# Patient Record
Sex: Female | Born: 1970 | Race: White | Hispanic: No | Marital: Married | Smoking: Never smoker
Health system: Southern US, Community
[De-identification: ages and names within clinical notes are randomized; demographics above are authoritative.]

## PROBLEM LIST (undated history)

## (undated) ENCOUNTER — Inpatient Hospital Stay (HOSPITAL_COMMUNITY): Payer: Self-pay

## (undated) DIAGNOSIS — I7300 Raynaud's syndrome without gangrene: Secondary | ICD-10-CM

## (undated) DIAGNOSIS — I499 Cardiac arrhythmia, unspecified: Secondary | ICD-10-CM

## (undated) HISTORY — PX: DILATION AND CURETTAGE OF UTERUS: SHX78

## (undated) HISTORY — DX: Raynaud's syndrome without gangrene: I73.00

## (undated) HISTORY — DX: Cardiac arrhythmia, unspecified: I49.9

---

## 1998-08-22 ENCOUNTER — Emergency Department (HOSPITAL_COMMUNITY): Admission: EM | Admit: 1998-08-22 | Discharge: 1998-08-22 | Payer: Self-pay | Admitting: Emergency Medicine

## 1998-08-22 ENCOUNTER — Encounter: Payer: Self-pay | Admitting: Emergency Medicine

## 1998-10-23 ENCOUNTER — Other Ambulatory Visit: Admission: RE | Admit: 1998-10-23 | Discharge: 1998-10-23 | Payer: Self-pay | Admitting: Obstetrics and Gynecology

## 2000-06-23 ENCOUNTER — Other Ambulatory Visit: Admission: RE | Admit: 2000-06-23 | Discharge: 2000-06-23 | Payer: Self-pay | Admitting: Obstetrics and Gynecology

## 2000-10-14 ENCOUNTER — Encounter: Payer: Self-pay | Admitting: Internal Medicine

## 2000-10-14 ENCOUNTER — Encounter: Admission: RE | Admit: 2000-10-14 | Discharge: 2000-10-14 | Payer: Self-pay | Admitting: Internal Medicine

## 2000-10-16 ENCOUNTER — Ambulatory Visit (HOSPITAL_COMMUNITY): Admission: RE | Admit: 2000-10-16 | Discharge: 2000-10-16 | Payer: Self-pay | Admitting: Internal Medicine

## 2000-12-11 ENCOUNTER — Other Ambulatory Visit: Admission: RE | Admit: 2000-12-11 | Discharge: 2000-12-11 | Payer: Self-pay | Admitting: Obstetrics and Gynecology

## 2001-09-22 ENCOUNTER — Other Ambulatory Visit: Admission: RE | Admit: 2001-09-22 | Discharge: 2001-09-22 | Payer: Self-pay | Admitting: Obstetrics and Gynecology

## 2002-02-21 ENCOUNTER — Emergency Department (HOSPITAL_COMMUNITY): Admission: EM | Admit: 2002-02-21 | Discharge: 2002-02-22 | Payer: Self-pay | Admitting: Emergency Medicine

## 2002-02-22 ENCOUNTER — Encounter: Payer: Self-pay | Admitting: Emergency Medicine

## 2002-10-26 ENCOUNTER — Other Ambulatory Visit: Admission: RE | Admit: 2002-10-26 | Discharge: 2002-10-26 | Payer: Self-pay | Admitting: Obstetrics and Gynecology

## 2003-10-28 ENCOUNTER — Other Ambulatory Visit: Admission: RE | Admit: 2003-10-28 | Discharge: 2003-10-28 | Payer: Self-pay | Admitting: Obstetrics and Gynecology

## 2005-01-30 ENCOUNTER — Other Ambulatory Visit: Admission: RE | Admit: 2005-01-30 | Discharge: 2005-01-30 | Payer: Self-pay | Admitting: Obstetrics and Gynecology

## 2005-08-21 ENCOUNTER — Inpatient Hospital Stay (HOSPITAL_COMMUNITY): Admission: AD | Admit: 2005-08-21 | Discharge: 2005-08-23 | Payer: Self-pay | Admitting: Obstetrics and Gynecology

## 2006-09-29 ENCOUNTER — Inpatient Hospital Stay (HOSPITAL_COMMUNITY): Admission: RE | Admit: 2006-09-29 | Discharge: 2006-09-30 | Payer: Self-pay | Admitting: Obstetrics and Gynecology

## 2008-03-29 ENCOUNTER — Inpatient Hospital Stay (HOSPITAL_COMMUNITY): Admission: RE | Admit: 2008-03-29 | Discharge: 2008-03-30 | Payer: Self-pay | Admitting: Obstetrics and Gynecology

## 2008-03-31 ENCOUNTER — Encounter: Admission: RE | Admit: 2008-03-31 | Discharge: 2008-04-30 | Payer: Self-pay | Admitting: Obstetrics and Gynecology

## 2008-05-01 ENCOUNTER — Encounter: Admission: RE | Admit: 2008-05-01 | Discharge: 2008-05-30 | Payer: Self-pay | Admitting: Obstetrics and Gynecology

## 2010-08-21 NOTE — Discharge Summary (Signed)
[name], [name]              ACCOUNT NO.:  [id number]   MEDICAL RECORD NO.:  [id number]          PATIENT TYPE:  INP   LOCATION:  9106                          FACILITY:  [facility]   PHYSICIAN:  Huel Cote, M.D. DATE OF BIRTH:  September 24, 1970   DATE OF ADMISSION:  03/29/2008  DATE OF DISCHARGE:  03/30/2008                               DISCHARGE SUMMARY   DISCHARGE DIAGNOSES:  1. Term pregnancy at 37 and 3/7th weeks delivered.  2. Status post normal spontaneous vaginal delivery.   DISCHARGE MEDICATION:  Motrin 600 mg p.o. every 6 hours.   DISCHARGE FOLLOWUP:  The patient is to follow up in my office in 6 weeks  for her routine postpartum exam.   HOSPITAL COURSE:  The patient is a 40 year old G4, P2-0-1-2 who is  admitted at 70 and 3/7th weeks' gestation for induction of labor at her  due date and a favorable cervix.  Prenatal care was uneventful.   PRENATAL LABS:  An O positive, antibody negative, RPR nonreactive,  rubella immune, hepatitis B surface antigen negative, HIV negative, GC  negative, Chlamydia negative, group B strep negative, 1-hour Glucola  100.  First trimester screen normal.   PAST OBSTETRICAL HISTORY:  In 2006, she had a spontaneous miscarriage.  In 2007, she had a vaginal delivery of a 7-pound 13-ounce infant.  In  2008, she had a vaginal delivery of a 7-pound 4-ounce infant.   PAST GYN HISTORY:  None.   PAST MEDICAL HISTORY:  None.   PAST SURGICAL HISTORY:  None.   ALLERGIES:  None.   MEDICATIONS:  Prenatal vitamins.   PHYSICAL EXAMINATION:  VITAL SIGNS:  On admission, she was afebrile with  stable vital signs.  PELVIC:  Fetal heart rate was reactive.  Cervix was 52+ dilated and a -2  station.   She had rupture of membranes performed with clear fluid and was placed  on Pitocin.  She progressed quickly throughout the day.  Had a rapid  descent and completion of dilation and pushed well with a normal  spontaneous vaginal delivery of a vigorous female  infant over a small  first-degree laceration.  Apgars were 9 and 9, weight was 7  pounds 15 ounces.  There was a nuchal cord x1, which was reduced over  the head.  Placenta was delivered spontaneously.  A first-degree  laceration was repaired with 2-0 Vicryl.  Cervix and rectum were intact.  Estimated blood loss 350 mL.      Huel Cote, M.D.  Electronically Signed     KR/MEDQ  D:  05/07/2008  T:  05/07/2008  Job:  [id number]

## 2011-01-11 LAB — CBC
HCT: 32.3 % — ABNORMAL LOW (ref 36.0–46.0)
HCT: 36.3 % (ref 36.0–46.0)
Hemoglobin: 11.5 g/dL — ABNORMAL LOW (ref 12.0–15.0)
Hemoglobin: 12.8 g/dL (ref 12.0–15.0)
MCHC: 35.4 g/dL (ref 30.0–36.0)
MCHC: 35.6 g/dL (ref 30.0–36.0)
MCV: 100.8 fL — ABNORMAL HIGH (ref 78.0–100.0)
MCV: 99.3 fL (ref 78.0–100.0)
Platelets: 128 10*3/uL — ABNORMAL LOW (ref 150–400)
Platelets: 150 10*3/uL (ref 150–400)
RBC: 3.21 MIL/uL — ABNORMAL LOW (ref 3.87–5.11)
RBC: 3.66 MIL/uL — ABNORMAL LOW (ref 3.87–5.11)
RDW: 12.6 % (ref 11.5–15.5)
RDW: 12.7 % (ref 11.5–15.5)
WBC: 10.5 10*3/uL (ref 4.0–10.5)
WBC: 9.9 10*3/uL (ref 4.0–10.5)

## 2011-01-11 LAB — RPR: RPR Ser Ql: NONREACTIVE

## 2011-01-23 LAB — CBC
HCT: 40.3
Platelets: 146 — ABNORMAL LOW
RBC: 3.38 — ABNORMAL LOW
WBC: 11.3 — ABNORMAL HIGH
WBC: 7.8

## 2011-01-23 LAB — RPR: RPR Ser Ql: NONREACTIVE

## 2011-08-09 ENCOUNTER — Encounter: Payer: Self-pay | Admitting: Family Medicine

## 2012-01-22 ENCOUNTER — Ambulatory Visit (INDEPENDENT_AMBULATORY_CARE_PROVIDER_SITE_OTHER): Payer: 59 | Admitting: Physician Assistant

## 2012-01-22 VITALS — BP 102/63 | HR 75 | Temp 98.2°F | Resp 20 | Ht 68.0 in | Wt 136.0 lb

## 2012-01-22 DIAGNOSIS — R05 Cough: Secondary | ICD-10-CM

## 2012-01-22 DIAGNOSIS — R059 Cough, unspecified: Secondary | ICD-10-CM

## 2012-01-22 DIAGNOSIS — J019 Acute sinusitis, unspecified: Secondary | ICD-10-CM

## 2012-01-22 MED ORDER — BENZONATATE 200 MG PO CAPS: 200.0000 mg | ORAL_CAPSULE | Freq: Two times a day (BID) | ORAL | Status: DC | PRN

## 2012-01-22 MED ORDER — GUAIFENESIN-CODEINE 100-10 MG/5ML PO SOLN: ORAL | Status: DC

## 2012-01-22 MED ORDER — AMOXICILLIN-POT CLAVULANATE 875-125 MG PO TABS: 1.0000 | ORAL_TABLET | Freq: Two times a day (BID) | ORAL | Status: DC

## 2012-01-22 MED ORDER — IPRATROPIUM BROMIDE 0.06 % NA SOLN: 2.0000 | Freq: Three times a day (TID) | NASAL | Status: DC

## 2012-01-22 NOTE — Progress Notes (Signed)
Patient ID: MEGGIN OLA MRN: [id number], DOB: 26-Jul-1970, 41 y.o. Date of Encounter: 01/22/2012, 8:54 PM  Primary Physician: No primary provider on file.  Chief Complaint:  Chief Complaint  Patient presents with  . Nasal Congestion    x 10 days  . Sinusitis  . Cough  . Otalgia    Bilateral  . Headache  . Facial Pain    Over sinuses    HPI: 41 y.o. year old female presents with a ten day history of worsening nasal congestion, post nasal drip, sore throat, sinus pressure, and cough. Afebrile, but some chills. Nasal congestion thick and green/yellow. Sinus pressure is the worst symptom along the right maxillary sinus. Cough is mildly productive. Patient states her pain in the right maxillary sinus worsens when she leans forward. Ears feel full, leading to sensation of muffled hearing. Has tried OTC cold preps without much real success. 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   No recent antibiotics, or recent travels  No leg trauma, sedentary periods, h/o cancer, or tobacco use.  Past Medical History  Diagnosis Date  . Raynaud's syndrome      Home Meds: Prior to Admission medications   Medication Sig Start Date End Date Taking? Authorizing Provider  fish oil-omega-3 fatty acids 1000 MG capsule Take 2 g by mouth daily.   Yes Historical Provider, MD  levonorgestrel (MIRENA) 20 MCG/24HR IUD 1 each by Intrauterine route once.   Yes Historical Provider, MD  NIFEdipine (PROCARDIA) 20 MG capsule Take 20 mg by mouth as needed.   Yes Historical Provider, MD                                Allergies: No Known Allergies  History   Social History  . Marital Status: Married    Spouse Name: N/A    Number of Children: N/A  . Years of Education: N/A   Occupational History  . Not on file.   Social History Main Topics  . Smoking status: Never Smoker   . Smokeless tobacco: Not on file  . Alcohol Use: Not on file  .  Drug Use: Not on file  . Sexually Active: Not on file   Other Topics Concern  . Not on file   Social History Narrative  . No narrative on file     Review of Systems: Constitutional: negative for fever, night sweats or weight changes Cardiovascular: negative for chest pain or palpitations Respiratory: negative for hemoptysis, wheezing, or shortness of breath Neurologic: positive for sinus headache   Physical Exam: Blood pressure 102/63, pulse 75, temperature 98.2 F (36.8 C), temperature source Oral, resp. rate 20, height 5\' 8"  (1.727 m), weight 136 lb (61.689 kg), last menstrual period 12/23/2011, SpO2 100.00%., Body mass index is 20.68 kg/(m^2). General: Well developed, well nourished, in no acute distress. Head: Normocephalic, atraumatic, eyes without discharge, sclera non-icteric. Bilateral auditory canals clear, TM's are without perforation, pearly grey with reflective cone of light bilaterally. Serous effusion bilaterally behind TM's. Right maxillary sinus TTP. Oral cavity moist, dentition normal. Posterior pharynx with post nasal drip and mild erythema. No peritonsillar abscess or tonsillar exudate. Neck: Supple. No thyromegaly. Full ROM. No lymphadenopathy. Lungs: Clear bilaterally to auscultation without wheezes, rales, or rhonchi. Breathing is unlabored.  Heart: RRR with S1 S2. No murmurs, rubs, or gallops appreciated. Msk:  Strength and tone normal for  age. Extremities: No clubbing or cyanosis. No edema. Neuro: Alert and oriented X 3. Moves all extremities spontaneously. CNII-XII grossly in tact. Psych:  Responds to questions appropriately with a normal affect.     ASSESSMENT AND PLAN:  41 y.o. year old female with sinusitis -Augmentin 875/125 mg 1 po bid #20 no RF -Atrovent NS 0.06% 2 sprays each nare bid prn #1 no RF -Robitussin AC 1 tsp po q 4-6 hours prn cough #120 mL no RF -Tessalon Perles 100 mg 1 po tid prn cough #30 no RF  -Mucinex -Can call if sinus pressure  persists along right maxillary sinus and will send in prednisone 20 mg 3x2, 2x2, 1x2 -Tylenol/Motrin prn -Rest/fluids -RTC precautions -RTC 3-5 days if no improvement  Elinor Dodge, PA-C 01/22/2012 8:54 PM

## 2012-01-22 NOTE — Patient Instructions (Addendum)
Sinusitis Take your Augmentin twice daily You can use the Atrovent nasal spray 1-3 times daily, if it is drying out your nose you can add some saline nasal spray If you continue to have sinus pressure over the weekend please give me a call and we can discuss other treatment options.   Sinusitis is redness, soreness, and swelling (inflammation) of the paranasal sinuses. Paranasal sinuses are air pockets within the bones of your face (beneath the eyes, the middle of the forehead, or above the eyes). In healthy paranasal sinuses, mucus is able to drain out, and air is able to circulate through them by way of your nose. However, when your paranasal sinuses are inflamed, mucus and air can become trapped. This can allow bacteria and other germs to grow and cause infection. Sinusitis can develop quickly and last only a short time (acute) or continue over a long period (chronic). Sinusitis that lasts for more than 12 weeks is considered chronic.  CAUSES  Causes of sinusitis include:  Allergies.  Structural abnormalities, such as displacement of the cartilage that separates your nostrils (deviated septum), which can decrease the air flow through your nose and sinuses and affect sinus drainage.  Functional abnormalities, such as when the small hairs (cilia) that line your sinuses and help remove mucus do not work properly or are not present. SYMPTOMS  Symptoms of acute and chronic sinusitis are the same. The primary symptoms are pain and pressure around the affected sinuses. Other symptoms include:  Upper toothache.  Earache.  Headache.  Bad breath.  Decreased sense of smell and taste.  A cough, which worsens when you are lying flat.  Fatigue.  Fever.  Thick drainage from your nose, which often is green and may contain pus (purulent).  Swelling and warmth over the affected sinuses. DIAGNOSIS  Your caregiver will perform a physical exam. During the exam, your caregiver may:  Look in  your nose for signs of abnormal growths in your nostrils (nasal polyps).  Tap over the affected sinus to check for signs of infection.  View the inside of your sinuses (endoscopy) with a special imaging device with a light attached (endoscope), which is inserted into your sinuses. If your caregiver suspects that you have chronic sinusitis, one or more of the following tests may be recommended:  Allergy tests.  Nasal culture A sample of mucus is taken from your nose and sent to a lab and screened for bacteria.  Nasal cytology A sample of mucus is taken from your nose and examined by your caregiver to determine if your sinusitis is related to an allergy. TREATMENT  Most cases of acute sinusitis are related to a viral infection and will resolve on their own within 10 days. Sometimes medicines are prescribed to help relieve symptoms (pain medicine, decongestants, nasal steroid sprays, or saline sprays).  However, for sinusitis related to a bacterial infection, your caregiver will prescribe antibiotic medicines. These are medicines that will help kill the bacteria causing the infection.  Rarely, sinusitis is caused by a fungal infection. In theses cases, your caregiver will prescribe antifungal medicine. For some cases of chronic sinusitis, surgery is needed. Generally, these are cases in which sinusitis recurs more than 3 times per year, despite other treatments. HOME CARE INSTRUCTIONS   Drink plenty of water. Water helps thin the mucus so your sinuses can drain more easily.  Use a humidifier.  Inhale steam 3 to 4 times a day (for example, sit in the bathroom with the shower running).  Apply a warm, moist washcloth to your face 3 to 4 times a day, or as directed by your caregiver.  Use saline nasal sprays to help moisten and clean your sinuses.  Take over-the-counter or prescription medicines for pain, discomfort, or fever only as directed by your caregiver. SEEK IMMEDIATE MEDICAL CARE  IF:  You have increasing pain or severe headaches.  You have nausea, vomiting, or drowsiness.  You have swelling around your face.  You have vision problems.  You have a stiff neck.  You have difficulty breathing. MAKE SURE YOU:   Understand these instructions.  Will watch your condition.  Will get help right away if you are not doing well or get worse. Document Released: 03/25/2005 Document Revised: 06/17/2011 Document Reviewed: 04/09/2011 [facility] Patient Information 2013 [place], [place].

## 2012-09-20 NOTE — H&P (Signed)
[name] is an 42 y.o. female. [id number] with an unfortunate finding of a missed abortion on [place] measuring 7+weeks.  Pt was having routine f/u US when fetal demise discovered.  She has had no VB or cramping.  Her blood type is Rh positive.  Pertinent Gynecological History: OB History: NSVD x 3                         SAB x 1 Menstrual History:  No LMP recorded.    Past Medical History  Diagnosis Date  . Raynaud's syndrome     No past surgical history on file.  No family history on file.  Social History:  reports that she has never smoked. She does not have any smokeless tobacco history on file. Her alcohol and drug histories are not on file.  Allergies: No Known Allergies  No prescriptions prior to admission    ROS  There were no vitals taken for this visit. Physical Exam  Constitutional: She is oriented to person, place, and time. She appears well-developed and well-nourished.  Cardiovascular: Normal rate and regular rhythm.   Respiratory: Effort normal and breath sounds normal.  GI: Soft.  Genitourinary: Vagina normal.  Uterus 8 weeks size  Neurological: She is alert and oriented to person, place, and time.  Psychiatric: She has a normal mood and affect. Her behavior is normal.    No results found for this or any previous visit (from the past 24 hour(s)).  No results found.  Assessment/Plan: The findings of missed abortion were discussed with the patient in detail. We discussed her options going forward with expectant management, cytotec induced bleeding, and D&C. The risks and benefits of each approach were reviewed and the patient elects to schedule a D&C. The risks of the procedure were reviewed with her in detail including bleeding, infection, and possible uterine perforation. She will be prescribed of cytotec to moisten and insert vaginally 3 hours prior to the procedure to aid with cervical dilation. Her blood type will be confirmed and the procedure  scheduled. She will also take doxycycline prior to and 12 hours followiing procedure.   Oliver Pila 09/20/2012, 9:47 PM

## 2012-09-21 ENCOUNTER — Encounter (HOSPITAL_COMMUNITY): Payer: Self-pay | Admitting: Pharmacist

## 2012-09-22 ENCOUNTER — Encounter (HOSPITAL_COMMUNITY): Payer: Self-pay | Admitting: *Deleted

## 2012-09-22 ENCOUNTER — Encounter (HOSPITAL_COMMUNITY): Payer: Self-pay | Admitting: Anesthesiology

## 2012-09-22 ENCOUNTER — Ambulatory Visit (HOSPITAL_COMMUNITY): Payer: 59 | Admitting: Anesthesiology

## 2012-09-22 ENCOUNTER — Ambulatory Visit (HOSPITAL_COMMUNITY)
Admission: RE | Admit: 2012-09-22 | Discharge: 2012-09-22 | Disposition: A | Discharge status: Home / Self Care | Payer: 59 | Source: Ambulatory Visit | Attending: Obstetrics and Gynecology | Admitting: Obstetrics and Gynecology

## 2012-09-22 ENCOUNTER — Encounter (HOSPITAL_COMMUNITY)
Admission: RE | Disposition: A | Discharge status: Home / Self Care | Payer: Self-pay | Source: Ambulatory Visit | Attending: Obstetrics and Gynecology

## 2012-09-22 DIAGNOSIS — O021 Missed abortion: Secondary | ICD-10-CM | POA: Insufficient documentation

## 2012-09-22 DIAGNOSIS — I7300 Raynaud's syndrome without gangrene: Secondary | ICD-10-CM | POA: Insufficient documentation

## 2012-09-22 HISTORY — PX: DILATION AND EVACUATION: SHX1459

## 2012-09-22 LAB — CBC
HCT: 40.4 % (ref 36.0–46.0)
Hemoglobin: 13.7 g/dL (ref 12.0–15.0)
MCV: 99.3 fL (ref 78.0–100.0)
RBC: 4.07 MIL/uL (ref 3.87–5.11)
WBC: 4.9 10*3/uL (ref 4.0–10.5)

## 2012-09-22 SURGERY — Surgical Case
Anesthesia: *Unknown

## 2012-09-22 SURGERY — DILATION AND EVACUATION, UTERUS
Anesthesia: Monitor Anesthesia Care | Site: Vagina | Wound class: Clean Contaminated

## 2012-09-22 MED ORDER — KETOROLAC TROMETHAMINE 30 MG/ML IJ SOLN
INTRAMUSCULAR | Status: DC | PRN
  Administered 2012-09-22: 30 mg via INTRAVENOUS

## 2012-09-22 MED ORDER — PROPOFOL 10 MG/ML IV EMUL
INTRAVENOUS | Status: AC
  Filled 2012-09-22: qty 40

## 2012-09-22 MED ORDER — ONDANSETRON HCL 4 MG/2ML IJ SOLN
INTRAMUSCULAR | Status: AC
  Filled 2012-09-22: qty 2

## 2012-09-22 MED ORDER — DEXAMETHASONE SODIUM PHOSPHATE 10 MG/ML IJ SOLN
INTRAMUSCULAR | Status: AC
  Filled 2012-09-22: qty 1

## 2012-09-22 MED ORDER — KETOROLAC TROMETHAMINE 60 MG/2ML IM SOLN
INTRAMUSCULAR | Status: DC | PRN
  Administered 2012-09-22: 30 mg via INTRAMUSCULAR

## 2012-09-22 MED ORDER — LIDOCAINE HCL (CARDIAC) 20 MG/ML IV SOLN
INTRAVENOUS | Status: AC
  Filled 2012-09-22: qty 5

## 2012-09-22 MED ORDER — IBUPROFEN 200 MG PO TABS: 600.0000 mg | ORAL_TABLET | Freq: Four times a day (QID) | ORAL | Status: DC | PRN

## 2012-09-22 MED ORDER — PROPOFOL 10 MG/ML IV EMUL
INTRAVENOUS | Status: DC | PRN
  Administered 2012-09-22: 200 ug/kg/min via INTRAVENOUS

## 2012-09-22 MED ORDER — FENTANYL CITRATE 0.05 MG/ML IJ SOLN
INTRAMUSCULAR | Status: DC | PRN
  Administered 2012-09-22: 50 ug via INTRAVENOUS

## 2012-09-22 MED ORDER — LACTATED RINGERS IV SOLN
INTRAVENOUS | Status: DC
  Administered 2012-09-22 (×2): via INTRAVENOUS

## 2012-09-22 MED ORDER — LIDOCAINE HCL 1 % IJ SOLN
INTRAMUSCULAR | Status: DC | PRN
  Administered 2012-09-22: 20 mL

## 2012-09-22 MED ORDER — LIDOCAINE HCL (CARDIAC) 20 MG/ML IV SOLN
INTRAVENOUS | Status: DC | PRN
  Administered 2012-09-22: 40 mg via INTRAVENOUS

## 2012-09-22 MED ORDER — MIDAZOLAM HCL 2 MG/2ML IJ SOLN
INTRAMUSCULAR | Status: AC
  Filled 2012-09-22: qty 2

## 2012-09-22 MED ORDER — FENTANYL CITRATE 0.05 MG/ML IJ SOLN
INTRAMUSCULAR | Status: AC
  Filled 2012-09-22: qty 2

## 2012-09-22 MED ORDER — LACTATED RINGERS IV SOLN: INTRAVENOUS | Status: DC

## 2012-09-22 MED ORDER — 0.9 % SODIUM CHLORIDE (POUR BTL) OPTIME
TOPICAL | Status: DC | PRN
  Administered 2012-09-22: 1000 mL

## 2012-09-22 MED ORDER — ONDANSETRON HCL 4 MG/2ML IJ SOLN
INTRAMUSCULAR | Status: DC | PRN
  Administered 2012-09-22: 4 mg via INTRAVENOUS

## 2012-09-22 SURGICAL SUPPLY — 19 items
CATH ROBINSON RED A/P 16FR (CATHETERS) ×2 IMPLANT
CLOTH BEACON ORANGE TIMEOUT ST (SAFETY) ×2 IMPLANT
DECANTER SPIKE VIAL GLASS SM (MISCELLANEOUS) ×2 IMPLANT
GLOVE BIO SURGEON STRL SZ 6.5 (GLOVE) ×4 IMPLANT
GLOVE SURG SS PI 6.5 STRL IVOR (GLOVE) ×2 IMPLANT
GOWN STRL REIN XL XLG (GOWN DISPOSABLE) ×4 IMPLANT
KIT BERKELEY 1ST TRIMESTER 3/8 (MISCELLANEOUS) ×2 IMPLANT
NEEDLE SPNL 22GX3.5 QUINCKE BK (NEEDLE) ×2 IMPLANT
NS IRRIG 1000ML POUR BTL (IV SOLUTION) ×2 IMPLANT
PACK VAGINAL MINOR WOMEN LF (CUSTOM PROCEDURE TRAY) ×2 IMPLANT
PAD OB MATERNITY 4.3X12.25 (PERSONAL CARE ITEMS) ×2 IMPLANT
PAD PREP 24X48 CUFFED NSTRL (MISCELLANEOUS) ×2 IMPLANT
SET BERKELEY SUCTION TUBING (SUCTIONS) ×2 IMPLANT
SYR CONTROL 10ML LL (SYRINGE) ×2 IMPLANT
TOWEL OR 17X24 6PK STRL BLUE (TOWEL DISPOSABLE) ×4 IMPLANT
VACURETTE 10 RIGID CVD (CANNULA) IMPLANT
VACURETTE 7MM CVD STRL WRAP (CANNULA) IMPLANT
VACURETTE 8 RIGID CVD (CANNULA) ×2 IMPLANT
VACURETTE 9 RIGID CVD (CANNULA) IMPLANT

## 2012-09-22 NOTE — Brief Op Note (Signed)
09/22/2012  10:04 AM  PATIENT:  [name]  42 y.o. female  PRE-OPERATIVE DIAGNOSIS:  missed abortion, [phone]  POST-OPERATIVE DIAGNOSIS:  missed abortion, [id number]  PROCEDURE:  Procedure(s) with comments: DILATATION AND EVACUATION (N/A) - 1 hr OR time  SURGEON:  Surgeon(s) and Role:    * Oliver Pila, MD - Primary   ASSISTANTS: none   ANESTHESIA:   local and IV sedation  EBL:  Total I/O In: 1100 [I.V.:1100] Out: -   BLOOD ADMINISTERED:none  DRAINS: none   LOCAL MEDICATIONS USED:  LIDOCAINE   SPECIMEN:  POC's  DISPOSITION OF SPECIMEN:  PATHOLOGY  COUNTS:  YES  TOURNIQUET:  * No tourniquets in log *  DICTATION: .Dragon Dictation  PLAN OF CARE: Discharge to home after PACU  PATIENT DISPOSITION:  PACU - hemodynamically stable.

## 2012-09-22 NOTE — Anesthesia Preprocedure Evaluation (Signed)
Anesthesia Evaluation  Patient identified by MRN, date of birth, ID band Patient awake    Reviewed: Allergy & Precautions, H&P , NPO status , Patient's Chart, lab work & pertinent test results  Airway Mallampati: I TM Distance: >3 FB Neck ROM: full    Dental no notable dental hx. (+) Teeth Intact   Pulmonary neg pulmonary ROS,    Pulmonary exam normal       Cardiovascular negative cardio ROS      Neuro/Psych negative neurological ROS  negative psych ROS   GI/Hepatic negative GI ROS, Neg liver ROS,   Endo/Other  negative endocrine ROS  Renal/GU negative Renal ROS  negative genitourinary   Musculoskeletal negative musculoskeletal ROS (+)   Abdominal Normal abdominal exam  (+)   Peds negative pediatric ROS (+)  Hematology negative hematology ROS (+)   Anesthesia Other Findings   Reproductive/Obstetrics negative OB ROS                           Anesthesia Physical Anesthesia Plan  ASA: I  Anesthesia Plan: MAC   Post-op Pain Management:    Induction:   Airway Management Planned:   Additional Equipment:   Intra-op Plan:   Post-operative Plan:   Informed Consent: I have reviewed the patients History and Physical, chart, labs and discussed the procedure including the risks, benefits and alternatives for the proposed anesthesia with the patient or authorized representative who has indicated his/her understanding and acceptance.   History available from chart only  Plan Discussed with: CRNA and Surgeon  Anesthesia Plan Comments:         Anesthesia Quick Evaluation

## 2012-09-22 NOTE — Anesthesia Postprocedure Evaluation (Signed)
Anesthesia Post Note  Patient: [name]  Procedure(s) Performed: Procedure(s) (LRB): DILATATION AND EVACUATION (N/A)  Anesthesia type: MAC  Patient location: PACU  Post pain: Pain level controlled  Post assessment: Post-op Vital signs reviewed  Last Vitals:  Filed Vitals:   09/22/12 1000  BP: 75/58  Pulse: 52  Temp: 36.4 C  Resp: 31    Post vital signs: Reviewed  Level of consciousness: sedated  Complications: No apparent anesthesia complications

## 2012-09-22 NOTE — Progress Notes (Signed)
Patient ID: [name], female   DOB: 1970/11/06, 42 y.o.   MRN: [id number] Per pt no changes in dictated H&P, brief exam WNL

## 2012-09-22 NOTE — Transfer of Care (Signed)
Immediate Anesthesia Transfer of Care Note  Patient: [name]  Procedure(s) Performed: Procedure(s) with comments: DILATATION AND EVACUATION (N/A) - 1 hr OR time  Patient Location: PACU  Anesthesia Type:MAC  Level of Consciousness: awake  Airway & Oxygen Therapy: Patient Spontanous Breathing  Post-op Assessment: Report given to PACU RN and Post -op Vital signs reviewed and stable  Post vital signs: stable  Complications: No apparent anesthesia complications

## 2012-09-22 NOTE — Op Note (Signed)
Operative note  Preoperative diagnosis Missed abortion at [redacted] weeks gestation  Postoperative diagnosis Same  Procedure Suction dilation and evacuation with paracervical block  Surgeon Dr. Huel Cote  Anesthesia MAC with 1% lidocaine paracervical block  Fluids Estimated blood loss minimal Urine output partially 50 cc straight catheter prior to procedure IV fluid 800 cc LR  Specimen Products of conception sent to pathology  Findings Uterus was approximately 70 weeks size gestation with moderate amount of products of conception obtained  Procedure After informed consent was obtained from the patient she was taken to the operating room where IV sedation was performed without difficulty. She was then prepped and draped in the dorsal lithotomy position in the normal sterile fashion. Appropriate time out was performed. A speculum was then placed within the vagina the cervix identified and injected at the anterior lip with approximately 2 cc of 1% lidocaine plain. An additional 9 cc each was placed at 2 and 10:00 for a paracervical block. The uterus itself was easily sounded to 8 cm and the cervix was found to be already partially dilated with the Cytotec. Minimal dilation was required with the [facility] dilators. A size 8 suction curette was easily introduced into the fundus and suction applied. A moderate amount of products of conception were obtained and an additional pass yielded no further tissue. Suction was discontinued and a sharp curettage performed with no further significant tissue noted. One additional pass with suction confirmed of this. All instruments and sponges were then removed from the patient's vagina the cervix tenaculum site was treated with silver nitrate stick and minimal bleeding was noted. The patient was awakened and taken to the recovery room in good condition.

## 2012-09-23 ENCOUNTER — Encounter (HOSPITAL_COMMUNITY): Payer: Self-pay | Admitting: Obstetrics and Gynecology

## 2013-01-19 LAB — OB RESULTS CONSOLE RUBELLA ANTIBODY, IGM: RUBELLA: IMMUNE

## 2013-01-19 LAB — OB RESULTS CONSOLE RPR: RPR: NONREACTIVE

## 2013-01-19 LAB — OB RESULTS CONSOLE ABO/RH: RH Type: POSITIVE

## 2013-01-19 LAB — OB RESULTS CONSOLE GC/CHLAMYDIA
CHLAMYDIA, DNA PROBE: NEGATIVE
GC PROBE AMP, GENITAL: NEGATIVE

## 2013-01-19 LAB — OB RESULTS CONSOLE ANTIBODY SCREEN: Antibody Screen: NEGATIVE

## 2013-01-19 LAB — OB RESULTS CONSOLE HEPATITIS B SURFACE ANTIGEN: Hepatitis B Surface Ag: NEGATIVE

## 2013-01-19 LAB — OB RESULTS CONSOLE HIV ANTIBODY (ROUTINE TESTING): HIV: NONREACTIVE

## 2013-01-22 ENCOUNTER — Inpatient Hospital Stay (HOSPITAL_COMMUNITY): Payer: 59

## 2013-01-22 ENCOUNTER — Encounter (HOSPITAL_COMMUNITY): Payer: Self-pay | Admitting: *Deleted

## 2013-01-22 ENCOUNTER — Inpatient Hospital Stay (HOSPITAL_COMMUNITY)
Admission: AD | Admit: 2013-01-22 | Discharge: 2013-01-22 | Disposition: A | Discharge status: Home / Self Care | Payer: 59 | Source: Ambulatory Visit | Attending: Obstetrics and Gynecology | Admitting: Obstetrics and Gynecology

## 2013-01-22 DIAGNOSIS — O468X9 Other antepartum hemorrhage, unspecified trimester: Secondary | ICD-10-CM

## 2013-01-22 DIAGNOSIS — O418X10 Other specified disorders of amniotic fluid and membranes, first trimester, not applicable or unspecified: Secondary | ICD-10-CM

## 2013-01-22 DIAGNOSIS — O209 Hemorrhage in early pregnancy, unspecified: Secondary | ICD-10-CM | POA: Insufficient documentation

## 2013-01-22 LAB — POCT PREGNANCY, URINE: Preg Test, Ur: POSITIVE — AB

## 2013-01-22 NOTE — MAU Note (Signed)
Patient presents to MAU with c/o vaginal bleeding that is spotting. States was told had a subchorionic hemorrhage by OB. States called OB and they referred her to MAU due to after hours. Denies pain at this time.

## 2013-01-22 NOTE — MAU Provider Note (Signed)
  History     CSN: [id number]  Arrival date and time: 01/22/13 1748   None     Chief Complaint  Patient presents with  . Vaginal Bleeding   HPI Pt is [redacted]w[redacted]d pregnant and presents with vaginal bleeding.  Pt had bleeding 2 weeks ago and had initial ultrasound with bleeding and then did not have any more bleeding until today.  Pt had a follow up ultrasound on Tuesday - 3 days ago and had an increase in [facility].  Today pt started bleeding at 4 oclock today- bled through clothes. 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  RN note: Patient presents to MAU with c/o vaginal bleeding that is spotting. States was told had a subchorionic hemorrhage by OB. States called OB and they referred her to MAU due to after hours. Denies pain at this time.   Past Medical History  Diagnosis Date  . Raynaud's syndrome     Past Surgical History  Procedure Laterality Date  . Dilation and evacuation N/A 09/22/2012    Procedure: DILATATION AND EVACUATION;  Surgeon: Oliver Pila, MD;  Location: [facility];  Service: Gynecology;  Laterality: N/A;  1 hr OR time    History reviewed. No pertinent family history.  History  Substance Use Topics  . Smoking status: Never Smoker   . Smokeless tobacco: Not on file  . Alcohol Use: No    Allergies: No Known Allergies  Prescriptions prior to admission  Medication Sig Dispense Refill  . ibuprofen (ADVIL) 200 MG tablet Take 3 tablets (600 mg total) by mouth every 6 (six) hours as needed for pain.  30 tablet  0  . Multiple Vitamin (MULTIVITAMIN WITH MINERALS) TABS Take 1 tablet by mouth daily.        ROS Physical Exam   Blood pressure 115/69, pulse 65, temperature 97.9 F (36.6 C), temperature source Oral, resp. rate 18, height 5\' 8"  (1.727 m), weight 64.32 kg (141 lb 12.8 oz), SpO2 100.00%.  Physical Exam  MAU Course  Procedures Care handed over to Sharen Counter, CNM   Assessment and Plan    LINEBERRY,SUSAN 01/22/2013, 7:31 PM   [place] results reviewed  with Dr Jackelyn Knife and patient. Will discharge home on pelvic rest and bleeding precautions US Ob Comp Less 14 Wks  01/22/2013   CLINICAL DATA:  Vaginal bleeding.  EXAM: OBSTETRIC <14 WK ULTRASOUND  TECHNIQUE: Transabdominal ultrasound was performed for evaluation of the gestation as well as the maternal uterus and adnexal regions.  COMPARISON:  None.  FINDINGS: Intrauterine gestational sac: Visualized/normal in shape.  Yolk sac:  None.  Embryo:  Present  Cardiac Activity: Present  Heart Rate: 174 bpm  MSD:   mm    w     d  CRL:    36  mm   10 w 4. D                  [place] EDC: 08/16/2013  Maternal uterus/adnexae: Moderate subchorionic hemorrhage.  Normal ovaries.  No free pelvic fluid collections.    IMPRESSION: Single living intrauterine fetus estimated at 10 weeks and 4 days gestation.  Moderate sized subchorionic hemorrhage.  Normal ovaries.     Electronically Signed   By: Loralie Champagne M.D.   On: 01/22/2013 21:21   Aviva Signs, CNM

## 2013-04-08 NOTE — L&D Delivery Note (Signed)
Delivery Note At 12:40 PM a viable and healthy female was delivered via Vaginal, Spontaneous Delivery (Presentation: Left Occiput ).  APGAR: 9,9 ; weight P .   Placenta status: Manually extracted.  Cord: 3 vessels with the following complications: None.    Anesthesia: Epidural  Episiotomy: none Lacerations: 1st degree perineal Suture Repair: 3.0 vicryl rapide Est. Blood Loss (mL): 400cc  Mom to postpartum.  Baby to Couplet care / Skin to Skin.  Ringo Sherod Bovard-Stuckert 08/17/2013, 12:58 PM  O+/Br/ RI  D/w pt & FOB circumcision for female infant, including r/b/a, wish to proceed.

## 2013-07-19 LAB — OB RESULTS CONSOLE GBS: GBS: POSITIVE

## 2013-08-03 ENCOUNTER — Telehealth (HOSPITAL_COMMUNITY): Payer: Self-pay | Admitting: *Deleted

## 2013-08-03 ENCOUNTER — Encounter (HOSPITAL_COMMUNITY): Payer: Self-pay | Admitting: *Deleted

## 2013-08-03 NOTE — Telephone Encounter (Signed)
Preadmission screen  

## 2013-08-16 NOTE — H&P (Signed)
[name] is a 43 y.o. female [id number] at 39+ weeks (EDD 08/21/13 by LMP c/w 7 week [place]) presentting for IOL at term.  Pregnancy complicated by first trimester bleeding and AMA.  Panorama test was low risk and AFP negative.  There was a fetal arrhytmia noted early on, but a fetal ECHO was WNL and the arrhythmia resolved.  The patient is GBS postive.   Maternal Medical History:  Contractions: Frequency: irregular.   Perceived severity is mild.    Fetal activity: Perceived fetal activity is normal.    Prenatal Complications - Diabetes: none.    OB History   Grav Para Term Preterm Abortions TAB SAB Ect Mult Living   6 3 3  2  0 2   3    SAB x 2 NSVD x 3 ([id number],[id number], 2009)  All 7+lbs  Past Medical History  Diagnosis Date  . Raynaud's syndrome   . Dysrhythmia     SVT   Past Surgical History  Procedure Laterality Date  . Dilation and evacuation N/A 09/22/2012    Procedure: DILATATION AND EVACUATION;  Surgeon: Logan Bores, MD;  Location: [facility];  Service: Gynecology;  Laterality: N/A;  1 hr OR time  . Dilation and curettage of uterus     Family History: family history is not on file. Social History:  reports that she has never smoked. She does not have any smokeless tobacco history on file. She reports that she does not drink alcohol or use illicit drugs.   Prenatal Transfer Tool  Maternal Diabetes: No Genetic Screening: Normal Maternal Ultrasounds/Referrals: Normal Fetal Ultrasounds or other Referrals:  Fetal echo--normal, done for irregular FHR Maternal Substance Abuse:  No Significant Maternal Medications:  None Significant Maternal Lab Results:  Lab values include: Group B Strep positive Other Comments:  None  ROS    Last menstrual period 11/14/2012. Maternal Exam:  Uterine Assessment: Contraction strength is mild.  Contraction frequency is irregular.   Abdomen: Patient reports no abdominal tenderness. Fetal presentation: vertex  Introitus: Normal vulva.  Normal vagina.  Pelvis: adequate for delivery.      Physical Exam  Constitutional: She is oriented to person, place, and time. She appears well-developed and well-nourished.  Cardiovascular: Normal rate and regular rhythm.   Respiratory: Effort normal and breath sounds normal.  GI: Soft.  Genitourinary: Vagina normal and uterus normal.  Neurological: She is alert and oriented to person, place, and time.  Psychiatric: She has a normal mood and affect. Her behavior is normal.    Prenatal labs: ABO, Rh: O/Positive/-- (10/14 0000) Antibody: Negative (10/14 0000) Rubella: Immune (10/14 0000) RPR: Nonreactive (10/14 0000)  HBsAg: Negative (10/14 0000)  HIV: Non-reactive (10/14 0000)  GBS: Positive (04/13 0000)  One hour GTT 65 Panorama and AFP negative  Assessment/Plan: Pt admitted for IOL at term.  Plan pitocin and AROM after GBS prophylaxis on board.  Epidural prn.   Logan Bores 08/16/2013, 12:59 PM

## 2013-08-17 ENCOUNTER — Encounter (HOSPITAL_COMMUNITY): Payer: Self-pay

## 2013-08-17 ENCOUNTER — Inpatient Hospital Stay (HOSPITAL_COMMUNITY)
Admission: RE | Admit: 2013-08-17 | Discharge: 2013-08-19 | DRG: 774 | Disposition: A | Discharge status: Home / Self Care | Payer: 59 | Source: Ambulatory Visit | Attending: Obstetrics and Gynecology | Admitting: Obstetrics and Gynecology

## 2013-08-17 ENCOUNTER — Encounter (HOSPITAL_COMMUNITY): Payer: 59 | Admitting: Anesthesiology

## 2013-08-17 ENCOUNTER — Inpatient Hospital Stay (HOSPITAL_COMMUNITY): Payer: 59 | Admitting: Anesthesiology

## 2013-08-17 VITALS — BP 101/67 | HR 75 | Temp 97.9°F | Resp 16 | Ht 68.0 in | Wt 170.0 lb

## 2013-08-17 DIAGNOSIS — O09529 Supervision of elderly multigravida, unspecified trimester: Secondary | ICD-10-CM | POA: Diagnosis present

## 2013-08-17 DIAGNOSIS — I739 Peripheral vascular disease, unspecified: Secondary | ICD-10-CM | POA: Diagnosis present

## 2013-08-17 DIAGNOSIS — Z3490 Encounter for supervision of normal pregnancy, unspecified, unspecified trimester: Secondary | ICD-10-CM

## 2013-08-17 DIAGNOSIS — O9989 Other specified diseases and conditions complicating pregnancy, childbirth and the puerperium: Secondary | ICD-10-CM

## 2013-08-17 DIAGNOSIS — I7300 Raynaud's syndrome without gangrene: Secondary | ICD-10-CM | POA: Diagnosis present

## 2013-08-17 DIAGNOSIS — O99892 Other specified diseases and conditions complicating childbirth: Secondary | ICD-10-CM | POA: Diagnosis present

## 2013-08-17 DIAGNOSIS — Z22330 Carrier of Group B streptococcus: Secondary | ICD-10-CM

## 2013-08-17 LAB — CBC
HCT: 42 % (ref 36.0–46.0)
Hemoglobin: 14.4 g/dL (ref 12.0–15.0)
MCH: 34.3 pg — ABNORMAL HIGH (ref 26.0–34.0)
MCHC: 34.3 g/dL (ref 30.0–36.0)
MCV: 100 fL (ref 78.0–100.0)
PLATELETS: 144 10*3/uL — AB (ref 150–400)
RBC: 4.2 MIL/uL (ref 3.87–5.11)
RDW: 13 % (ref 11.5–15.5)
WBC: 8.2 10*3/uL (ref 4.0–10.5)

## 2013-08-17 LAB — ABO/RH: ABO/RH(D): O POS

## 2013-08-17 LAB — RPR

## 2013-08-17 MED ORDER — FENTANYL 2.5 MCG/ML BUPIVACAINE 1/10 % EPIDURAL INFUSION (WH - ANES)
14.0000 mL/h | INTRAMUSCULAR | Status: DC | PRN
Start: 2013-08-17 — End: 2013-08-19
  Administered 2013-08-17: 14 mL/h via EPIDURAL

## 2013-08-17 MED ORDER — OXYCODONE-ACETAMINOPHEN 5-325 MG PO TABS: 1.0000 | ORAL_TABLET | ORAL | Status: DC | PRN

## 2013-08-17 MED ORDER — BUTORPHANOL TARTRATE 1 MG/ML IJ SOLN: 1.0000 mg | INTRAMUSCULAR | Status: DC | PRN

## 2013-08-17 MED ORDER — TERBUTALINE SULFATE 1 MG/ML IJ SOLN: 0.2500 mg | Freq: Once | INTRAMUSCULAR | Status: DC | PRN

## 2013-08-17 MED ORDER — EPHEDRINE 5 MG/ML INJ
10.0000 mg | INTRAVENOUS | Status: DC | PRN
  Filled 2013-08-17: qty 2

## 2013-08-17 MED ORDER — DIPHENHYDRAMINE HCL 25 MG PO CAPS: 25.0000 mg | ORAL_CAPSULE | Freq: Four times a day (QID) | ORAL | Status: DC | PRN

## 2013-08-17 MED ORDER — EPHEDRINE 5 MG/ML INJ
INTRAVENOUS | Status: DC
Start: 2013-08-17 — End: 2013-08-17
  Filled 2013-08-17: qty 4

## 2013-08-17 MED ORDER — DIBUCAINE 1 % RE OINT: 1.0000 "application " | TOPICAL_OINTMENT | RECTAL | Status: DC | PRN

## 2013-08-17 MED ORDER — BENZOCAINE-MENTHOL 20-0.5 % EX AERO: 1.0000 "application " | INHALATION_SPRAY | CUTANEOUS | Status: DC | PRN

## 2013-08-17 MED ORDER — PHENYLEPHRINE 40 MCG/ML (10ML) SYRINGE FOR IV PUSH (FOR BLOOD PRESSURE SUPPORT)
80.0000 ug | PREFILLED_SYRINGE | INTRAVENOUS | Status: DC | PRN
  Filled 2013-08-17: qty 2

## 2013-08-17 MED ORDER — SIMETHICONE 80 MG PO CHEW: 80.0000 mg | CHEWABLE_TABLET | ORAL | Status: DC | PRN

## 2013-08-17 MED ORDER — ONDANSETRON HCL 4 MG/2ML IJ SOLN: 4.0000 mg | Freq: Four times a day (QID) | INTRAMUSCULAR | Status: DC | PRN

## 2013-08-17 MED ORDER — PENICILLIN G POTASSIUM 5000000 UNITS IJ SOLR
5.0000 10*6.[IU] | Freq: Once | INTRAVENOUS | Status: AC
  Administered 2013-08-17: 5 10*6.[IU] via INTRAVENOUS
  Filled 2013-08-17: qty 5

## 2013-08-17 MED ORDER — FENTANYL 2.5 MCG/ML BUPIVACAINE 1/10 % EPIDURAL INFUSION (WH - ANES)
INTRAMUSCULAR | Status: AC
  Filled 2013-08-17: qty 125

## 2013-08-17 MED ORDER — ACETAMINOPHEN 325 MG PO TABS: 650.0000 mg | ORAL_TABLET | ORAL | Status: DC | PRN

## 2013-08-17 MED ORDER — LANOLIN HYDROUS EX OINT: TOPICAL_OINTMENT | CUTANEOUS | Status: DC | PRN

## 2013-08-17 MED ORDER — PENICILLIN G POTASSIUM 5000000 UNITS IJ SOLR
2.5000 10*6.[IU] | INTRAVENOUS | Status: DC
  Administered 2013-08-17: 2.5 10*6.[IU] via INTRAVENOUS
  Filled 2013-08-17 (×5): qty 2.5

## 2013-08-17 MED ORDER — OXYTOCIN 40 UNITS IN LACTATED RINGERS INFUSION - SIMPLE MED
1.0000 m[IU]/min | INTRAVENOUS | Status: DC
  Administered 2013-08-17: 2 m[IU]/min via INTRAVENOUS
  Filled 2013-08-17: qty 1000

## 2013-08-17 MED ORDER — CITRIC ACID-SODIUM CITRATE 334-500 MG/5ML PO SOLN: 30.0000 mL | ORAL | Status: DC | PRN

## 2013-08-17 MED ORDER — LACTATED RINGERS IV SOLN
INTRAVENOUS | Status: DC
  Administered 2013-08-17 (×2): via INTRAVENOUS

## 2013-08-17 MED ORDER — LIDOCAINE HCL (PF) 1 % IJ SOLN
30.0000 mL | INTRAMUSCULAR | Status: DC | PRN
  Filled 2013-08-17: qty 30

## 2013-08-17 MED ORDER — PRENATAL MULTIVITAMIN CH
1.0000 | ORAL_TABLET | Freq: Every day | ORAL | Status: DC
  Administered 2013-08-17 – 2013-08-18 (×2): 1 via ORAL
  Filled 2013-08-17 (×2): qty 1

## 2013-08-17 MED ORDER — SENNOSIDES-DOCUSATE SODIUM 8.6-50 MG PO TABS
2.0000 | ORAL_TABLET | ORAL | Status: DC
  Administered 2013-08-17 – 2013-08-19 (×2): 2 via ORAL
  Filled 2013-08-17 (×2): qty 2

## 2013-08-17 MED ORDER — WITCH HAZEL-GLYCERIN EX PADS: 1.0000 "application " | MEDICATED_PAD | CUTANEOUS | Status: DC | PRN

## 2013-08-17 MED ORDER — TETANUS-DIPHTH-ACELL PERTUSSIS 5-2.5-18.5 LF-MCG/0.5 IM SUSP: 0.5000 mL | Freq: Once | INTRAMUSCULAR | Status: DC

## 2013-08-17 MED ORDER — CEFAZOLIN SODIUM-DEXTROSE 2-3 GM-% IV SOLR
2.0000 g | Freq: Three times a day (TID) | INTRAVENOUS | Status: AC
  Administered 2013-08-17 (×2): 2 g via INTRAVENOUS
  Filled 2013-08-17 (×2): qty 50

## 2013-08-17 MED ORDER — LACTATED RINGERS IV SOLN
500.0000 mL | Freq: Once | INTRAVENOUS | Status: AC
  Administered 2013-08-17: 500 mL via INTRAVENOUS

## 2013-08-17 MED ORDER — LACTATED RINGERS IV SOLN: INTRAVENOUS | Status: DC

## 2013-08-17 MED ORDER — ONDANSETRON HCL 4 MG/2ML IJ SOLN: 4.0000 mg | INTRAMUSCULAR | Status: DC | PRN

## 2013-08-17 MED ORDER — OXYTOCIN BOLUS FROM INFUSION: 500.0000 mL | INTRAVENOUS | Status: DC

## 2013-08-17 MED ORDER — IBUPROFEN 600 MG PO TABS: 600.0000 mg | ORAL_TABLET | Freq: Four times a day (QID) | ORAL | Status: DC | PRN

## 2013-08-17 MED ORDER — LIDOCAINE HCL (PF) 1 % IJ SOLN
INTRAMUSCULAR | Status: DC | PRN
  Administered 2013-08-17 (×2): 5 mL

## 2013-08-17 MED ORDER — ZOLPIDEM TARTRATE 5 MG PO TABS: 5.0000 mg | ORAL_TABLET | Freq: Every evening | ORAL | Status: DC | PRN

## 2013-08-17 MED ORDER — PHENYLEPHRINE 40 MCG/ML (10ML) SYRINGE FOR IV PUSH (FOR BLOOD PRESSURE SUPPORT)
PREFILLED_SYRINGE | INTRAVENOUS | Status: DC
Start: 2013-08-17 — End: 2013-08-17
  Filled 2013-08-17: qty 10

## 2013-08-17 MED ORDER — OXYTOCIN 40 UNITS IN LACTATED RINGERS INFUSION - SIMPLE MED
62.5000 mL/h | INTRAVENOUS | Status: DC
  Administered 2013-08-17: 62.5 mL/h via INTRAVENOUS

## 2013-08-17 MED ORDER — DIPHENHYDRAMINE HCL 50 MG/ML IJ SOLN: 12.5000 mg | INTRAMUSCULAR | Status: DC | PRN

## 2013-08-17 MED ORDER — ONDANSETRON HCL 4 MG PO TABS: 4.0000 mg | ORAL_TABLET | ORAL | Status: DC | PRN

## 2013-08-17 MED ORDER — LACTATED RINGERS IV SOLN: 500.0000 mL | INTRAVENOUS | Status: DC | PRN

## 2013-08-17 MED ORDER — IBUPROFEN 600 MG PO TABS
600.0000 mg | ORAL_TABLET | Freq: Four times a day (QID) | ORAL | Status: DC
  Administered 2013-08-17 – 2013-08-19 (×7): 600 mg via ORAL
  Filled 2013-08-17 (×7): qty 1

## 2013-08-17 NOTE — Progress Notes (Signed)
Patient ID: [name], female   DOB: [birth year], 43 y.o.   MRN: [id number] Pt feeling mild contractions PCN on board for +GBS Cervix 50/2/-2 AROM clear  Continue pitocin

## 2013-08-17 NOTE — Anesthesia Procedure Notes (Signed)
Epidural Patient location during procedure: OB Start time: 08/17/2013 12:24 PM  Staffing Anesthesiologist: Rudean Curt Performed by: anesthesiologist   Preanesthetic Checklist Completed: patient identified, site marked, surgical consent, pre-op evaluation, timeout performed, IV checked, risks and benefits discussed and monitors and equipment checked  Epidural Patient position: sitting Prep: site prepped and draped and DuraPrep Patient monitoring: continuous pulse ox and blood pressure Approach: midline Location: L3-L4 Injection technique: LOR air  Needle:  Needle type: Tuohy  Needle gauge: 17 G Needle length: 9 cm and 9 Needle insertion depth: 4 cm Catheter type: closed end flexible Catheter size: 19 Gauge Catheter at skin depth: 10 cm Test dose: negative  Assessment Events: blood not aspirated, injection not painful, no injection resistance, negative IV test and no paresthesia  Additional Notes Patient identified.  Risk benefits discussed including failed block, incomplete pain control, headache, nerve damage, paralysis, blood pressure changes, nausea, vomiting, reactions to medication both toxic or allergic, and postpartum back pain.  Patient expressed understanding and wished to proceed.  All questions were answered.  Sterile technique used throughout procedure and epidural site dressed with sterile barrier dressing. No paresthesia or other complications noted.The patient did not experience any signs of intravascular injection such as tinnitus or metallic taste in mouth nor signs of intrathecal spread such as rapid motor block. Please see nursing notes for vital signs.

## 2013-08-17 NOTE — Anesthesia Preprocedure Evaluation (Signed)
Anesthesia Evaluation  Patient identified by MRN, date of birth, ID band Patient awake    Reviewed: Allergy & Precautions, H&P , Patient's Chart, lab work & pertinent test results  Airway Mallampati: II TM Distance: >3 FB Neck ROM: full    Dental   Pulmonary  breath sounds clear to auscultation        Cardiovascular + Peripheral Vascular Disease + dysrhythmias Supra Ventricular Tachycardia Rhythm:regular Rate:Normal     Neuro/Psych    GI/Hepatic   Endo/Other    Renal/GU      Musculoskeletal   Abdominal   Peds  Hematology   Anesthesia Other Findings   Reproductive/Obstetrics (+) Pregnancy                           Anesthesia Physical Anesthesia Plan  ASA: II  Anesthesia Plan: Epidural   Post-op Pain Management:    Induction:   Airway Management Planned:   Additional Equipment:   Intra-op Plan:   Post-operative Plan:   Informed Consent: I have reviewed the patients History and Physical, chart, labs and discussed the procedure including the risks, benefits and alternatives for the proposed anesthesia with the patient or authorized representative who has indicated his/her understanding and acceptance.     Plan Discussed with:   Anesthesia Plan Comments:         Anesthesia Quick Evaluation

## 2013-08-18 LAB — CBC
HCT: 36.6 % (ref 36.0–46.0)
Hemoglobin: 12.4 g/dL (ref 12.0–15.0)
MCH: 33.9 pg (ref 26.0–34.0)
MCHC: 33.9 g/dL (ref 30.0–36.0)
MCV: 100 fL (ref 78.0–100.0)
PLATELETS: 129 10*3/uL — AB (ref 150–400)
RBC: 3.66 MIL/uL — ABNORMAL LOW (ref 3.87–5.11)
RDW: 13 % (ref 11.5–15.5)
WBC: 8.9 10*3/uL (ref 4.0–10.5)

## 2013-08-18 NOTE — Progress Notes (Signed)
Post Partum Day 1 Subjective: no complaints, up ad lib and tolerating PO  Objective: Blood pressure 103/65, pulse 51, temperature 97.6 F (36.4 C), temperature source Oral, resp. rate 18, height 5' 8"  (1.727 m), weight 77.111 kg (170 lb), last menstrual period 11/14/2012, SpO2 100.00%, unknown if currently breastfeeding.  Physical Exam:  General: alert and cooperative Lochia: appropriate Uterine Fundus: firm    Recent Labs  08/17/13 0725 08/18/13 0620  HGB 14.4 12.4  HCT 42.0 36.6    Assessment/Plan: Plan for discharge tomorrow   LOS: 1 day   Logan Bores 08/18/2013, 8:08 AM

## 2013-08-18 NOTE — Anesthesia Postprocedure Evaluation (Signed)
  Anesthesia Post-op Note  Patient: [name]  Procedure(s) Performed: * No procedures listed *  Patient Location: Mother/Baby  Anesthesia Type:Epidural  Level of Consciousness: awake  Airway and Oxygen Therapy: Patient Spontanous Breathing  Post-op Pain: mild  Post-op Assessment: Patient's Cardiovascular Status Stable and Respiratory Function Stable  Post-op Vital Signs: stable  Last Vitals:  Filed Vitals:   08/18/13 0608  BP: 103/65  Pulse: 51  Temp: 36.4 C  Resp: 18    Complications: No apparent anesthesia complications

## 2013-08-18 NOTE — Discharge Summary (Signed)
Obstetric Discharge Summary Reason for Admission: induction of labor Prenatal Procedures: none Intrapartum Procedures: spontaneous vaginal delivery Postpartum Procedures: none Complications-Operative and Postpartum: first degree perineal laceration Hemoglobin  Date Value Ref Range Status  08/18/2013 12.4  12.0 - 15.0 g/dL Final     HCT  Date Value Ref Range Status  08/18/2013 36.6  36.0 - 46.0 % Final    Physical Exam:  General: alert and cooperative Lochia: appropriate Uterine Fundus: firm   Discharge Diagnoses: Term Pregnancy-delivered  Discharge Information: Date: 08/18/2013 Activity: pelvic rest Diet: routine Medications: Ibuprofen Condition: improved Instructions: refer to practice specific booklet Discharge to: home Follow-up Information   Follow up with Logan Bores, MD In 6 weeks. (postpartum)    Specialty:  Obstetrics and Gynecology   Contact information:   [address], [place] [address] [phone]       Newborn Data: Live born female  Birth Weight: 6 lb 10.5 oz (3020 g) APGAR: 9, 9  Home with mother.  Logan Bores 08/18/2013, 8:16 PM

## 2013-08-19 MED ORDER — IBUPROFEN 600 MG PO TABS: 600.0000 mg | ORAL_TABLET | Freq: Four times a day (QID) | ORAL | Status: DC

## 2013-08-19 NOTE — Lactation Note (Signed)
This note was copied from the chart of [facility]. Lactation Consultation Note: Follow up visit with this experienced mom. Reports that breastfeeding is going well. Since he woke up after circ. Reports that breasts are feeling fuller this morning. No questions at present. To call prn  Patient Name: [name] [id number] Date: 08/19/2013 Reason for consult: Follow-up assessment   Maternal Data Formula Feeding for Exclusion: No Infant to breast within first hour of birth: Yes Does the patient have breastfeeding experience prior to this delivery?: Yes  Feeding Feeding Type: Breast Fed Length of feed: 20 min  LATCH Score/Interventions Latch: Grasps breast easily, tongue down, lips flanged, rhythmical sucking.  Audible Swallowing: Spontaneous and intermittent  Type of Nipple: Everted at rest and after stimulation  Comfort (Breast/Nipple): Soft / non-tender     Hold (Positioning): No assistance needed to correctly position infant at breast.  LATCH Score: 10  Lactation Tools Discussed/Used     Consult Status Consult Status: Complete    Truddie Crumble 08/19/2013, 9:17 AM

## 2013-08-19 NOTE — Progress Notes (Signed)
Post Partum Day 2 Subjective: no complaints, up ad lib and tolerating PO  Objective: Blood pressure 101/67, pulse 75, temperature 97.9 F (36.6 C), temperature source Oral, resp. rate 16, height 5' 8"  (1.727 m), weight 77.111 kg (170 lb), last menstrual period 11/14/2012, SpO2 100.00%, unknown if currently breastfeeding.  Physical Exam:  General: alert and cooperative Lochia: appropriate Uterine Fundus: firm   Recent Labs  08/17/13 0725 08/18/13 0620  HGB 14.4 12.4  HCT 42.0 36.6    Assessment/Plan: Discharge home   LOS: 2 days   Logan Bores 08/19/2013, 7:18 AM

## 2013-08-25 NOTE — Progress Notes (Signed)
Ur chart review completed per request.

## 2014-02-07 ENCOUNTER — Encounter (HOSPITAL_COMMUNITY): Payer: Self-pay

## 2014-07-18 LAB — HM PAP SMEAR

## 2014-12-13 ENCOUNTER — Ambulatory Visit (HOSPITAL_COMMUNITY)
Admission: RE | Admit: 2014-12-13 | Discharge: 2014-12-13 | Disposition: A | Discharge status: Home / Self Care | Payer: 59 | Source: Ambulatory Visit | Attending: Neurosurgery | Admitting: Neurosurgery

## 2014-12-13 ENCOUNTER — Other Ambulatory Visit (HOSPITAL_COMMUNITY): Payer: Self-pay | Admitting: Neurosurgery

## 2014-12-13 DIAGNOSIS — R200 Anesthesia of skin: Secondary | ICD-10-CM | POA: Diagnosis not present

## 2014-12-13 DIAGNOSIS — M6281 Muscle weakness (generalized): Secondary | ICD-10-CM

## 2014-12-13 DIAGNOSIS — R531 Weakness: Secondary | ICD-10-CM | POA: Diagnosis present

## 2014-12-13 DIAGNOSIS — M5117 Intervertebral disc disorders with radiculopathy, lumbosacral region: Secondary | ICD-10-CM | POA: Insufficient documentation

## 2014-12-14 ENCOUNTER — Encounter (HOSPITAL_COMMUNITY): Payer: Self-pay | Admitting: *Deleted

## 2014-12-15 ENCOUNTER — Observation Stay (HOSPITAL_COMMUNITY): Payer: 59

## 2014-12-15 ENCOUNTER — Observation Stay (HOSPITAL_COMMUNITY)
Admission: RE | Admit: 2014-12-15 | Discharge: 2014-12-15 | Disposition: A | Discharge status: Home / Self Care | Payer: 59 | Source: Ambulatory Visit | Attending: Neurosurgery | Admitting: Neurosurgery

## 2014-12-15 ENCOUNTER — Ambulatory Visit (HOSPITAL_COMMUNITY): Payer: 59 | Admitting: Anesthesiology

## 2014-12-15 ENCOUNTER — Encounter (HOSPITAL_COMMUNITY)
Admission: RE | Disposition: A | Discharge status: Home / Self Care | Payer: Self-pay | Source: Ambulatory Visit | Attending: Neurosurgery

## 2014-12-15 ENCOUNTER — Encounter (HOSPITAL_COMMUNITY): Payer: Self-pay | Admitting: Certified Registered Nurse Anesthetist

## 2014-12-15 DIAGNOSIS — M549 Dorsalgia, unspecified: Secondary | ICD-10-CM | POA: Diagnosis present

## 2014-12-15 DIAGNOSIS — M5116 Intervertebral disc disorders with radiculopathy, lumbar region: Principal | ICD-10-CM | POA: Insufficient documentation

## 2014-12-15 DIAGNOSIS — M5126 Other intervertebral disc displacement, lumbar region: Secondary | ICD-10-CM | POA: Diagnosis present

## 2014-12-15 DIAGNOSIS — Z419 Encounter for procedure for purposes other than remedying health state, unspecified: Secondary | ICD-10-CM

## 2014-12-15 HISTORY — PX: LUMBAR LAMINECTOMY/DECOMPRESSION MICRODISCECTOMY: SHX5026

## 2014-12-15 LAB — BASIC METABOLIC PANEL
ANION GAP: 7 (ref 5–15)
BUN: 13 mg/dL (ref 6–20)
CALCIUM: 9 mg/dL (ref 8.9–10.3)
CO2: 25 mmol/L (ref 22–32)
Chloride: 108 mmol/L (ref 101–111)
Creatinine, Ser: 0.82 mg/dL (ref 0.44–1.00)
GFR calc Af Amer: >60 mL/min (ref >60)
GFR calc non Af Amer: >60 mL/min (ref >60)
GLUCOSE: 84 mg/dL (ref 65–99)
Potassium: 4 mmol/L (ref 3.5–5.1)
Sodium: 140 mmol/L (ref 135–145)

## 2014-12-15 LAB — CBC
HEMATOCRIT: 40.3 % (ref 36.0–46.0)
Hemoglobin: 13.3 g/dL (ref 12.0–15.0)
MCH: 32.8 pg (ref 26.0–34.0)
MCHC: 33 g/dL (ref 30.0–36.0)
MCV: 99.5 fL (ref 78.0–100.0)
PLATELETS: 173 10*3/uL (ref 150–400)
RBC: 4.05 MIL/uL (ref 3.87–5.11)
RDW: 12.7 % (ref 11.5–15.5)
WBC: 4.4 10*3/uL (ref 4.0–10.5)

## 2014-12-15 LAB — SURGICAL PCR SCREEN
MRSA, PCR: NEGATIVE
STAPHYLOCOCCUS AUREUS: NEGATIVE

## 2014-12-15 LAB — HCG, SERUM, QUALITATIVE: Preg, Serum: NEGATIVE

## 2014-12-15 SURGERY — LUMBAR LAMINECTOMY/DECOMPRESSION MICRODISCECTOMY 1 LEVEL
Anesthesia: General | Site: Spine Lumbar | Laterality: Left

## 2014-12-15 MED ORDER — MENTHOL 3 MG MT LOZG: 1.0000 | LOZENGE | OROMUCOSAL | Status: DC | PRN

## 2014-12-15 MED ORDER — DOCUSATE SODIUM 100 MG PO CAPS: 100.0000 mg | ORAL_CAPSULE | Freq: Two times a day (BID) | ORAL | Status: DC

## 2014-12-15 MED ORDER — PHENOL 1.4 % MT LIQD
1.0000 | OROMUCOSAL | Status: DC | PRN
Start: 2014-12-15 — End: 2014-12-15

## 2014-12-15 MED ORDER — METHOCARBAMOL 500 MG PO TABS
500.0000 mg | ORAL_TABLET | Freq: Four times a day (QID) | ORAL | Status: DC | PRN
Start: 2014-12-15 — End: 2014-12-15
  Administered 2014-12-15: 500 mg via ORAL
  Filled 2014-12-15: qty 1

## 2014-12-15 MED ORDER — POLYETHYLENE GLYCOL 3350 17 G PO PACK: 17.0000 g | PACK | Freq: Every day | ORAL | Status: DC | PRN

## 2014-12-15 MED ORDER — LACTATED RINGERS IV SOLN
INTRAVENOUS | Status: DC
  Administered 2014-12-15 (×2): via INTRAVENOUS

## 2014-12-15 MED ORDER — PROPOFOL INFUSION 10 MG/ML OPTIME
INTRAVENOUS | Status: DC | PRN
  Administered 2014-12-15: 50 ug/kg/min via INTRAVENOUS

## 2014-12-15 MED ORDER — HYDROCODONE-ACETAMINOPHEN 5-325 MG PO TABS: 1.0000 | ORAL_TABLET | ORAL | Status: DC | PRN

## 2014-12-15 MED ORDER — METHYLPREDNISOLONE ACETATE 80 MG/ML IJ SUSP
INTRAMUSCULAR | Status: DC | PRN
  Administered 2014-12-15: 80 mg

## 2014-12-15 MED ORDER — SODIUM CHLORIDE 0.9 % IJ SOLN: 3.0000 mL | Freq: Two times a day (BID) | INTRAMUSCULAR | Status: DC

## 2014-12-15 MED ORDER — GLYCOPYRROLATE 0.2 MG/ML IJ SOLN
INTRAMUSCULAR | Status: AC
  Filled 2014-12-15: qty 2

## 2014-12-15 MED ORDER — ONDANSETRON HCL 4 MG/2ML IJ SOLN
INTRAMUSCULAR | Status: AC
  Filled 2014-12-15: qty 2

## 2014-12-15 MED ORDER — OXYCODONE-ACETAMINOPHEN 5-325 MG PO TABS: 1.0000 | ORAL_TABLET | ORAL | Status: DC | PRN

## 2014-12-15 MED ORDER — PROMETHAZINE HCL 25 MG/ML IJ SOLN
6.2500 mg | INTRAMUSCULAR | Status: DC | PRN
Start: 2014-12-15 — End: 2014-12-15
  Administered 2014-12-15: 6.25 mg via INTRAVENOUS

## 2014-12-15 MED ORDER — NAPROXEN 250 MG PO TABS
250.0000 mg | ORAL_TABLET | Freq: Two times a day (BID) | ORAL | Status: DC
  Administered 2014-12-15: 250 mg via ORAL
  Filled 2014-12-15 (×2): qty 1

## 2014-12-15 MED ORDER — ACETAMINOPHEN 325 MG PO TABS: 325.0000 mg | ORAL_TABLET | Freq: Once | ORAL | Status: DC

## 2014-12-15 MED ORDER — ONDANSETRON HCL 4 MG/2ML IJ SOLN: 4.0000 mg | INTRAMUSCULAR | Status: DC | PRN

## 2014-12-15 MED ORDER — BISACODYL 10 MG RE SUPP: 10.0000 mg | Freq: Every day | RECTAL | Status: DC | PRN

## 2014-12-15 MED ORDER — MIDAZOLAM HCL 2 MG/2ML IJ SOLN
INTRAMUSCULAR | Status: AC
  Filled 2014-12-15: qty 2

## 2014-12-15 MED ORDER — FENTANYL CITRATE (PF) 100 MCG/2ML IJ SOLN
INTRAMUSCULAR | Status: DC
Start: 2014-12-15 — End: 2014-12-15
  Filled 2014-12-15: qty 2

## 2014-12-15 MED ORDER — ROCURONIUM BROMIDE 100 MG/10ML IV SOLN
INTRAVENOUS | Status: DC | PRN
  Administered 2014-12-15: 50 mg via INTRAVENOUS

## 2014-12-15 MED ORDER — IBUPROFEN 200 MG PO TABS: 600.0000 mg | ORAL_TABLET | Freq: Four times a day (QID) | ORAL | Status: DC

## 2014-12-15 MED ORDER — PRENATAL MULTIVITAMIN CH: 1.0000 | ORAL_TABLET | Freq: Every day | ORAL | Status: DC

## 2014-12-15 MED ORDER — ALUM & MAG HYDROXIDE-SIMETH 200-200-20 MG/5ML PO SUSP: 30.0000 mL | Freq: Four times a day (QID) | ORAL | Status: DC | PRN

## 2014-12-15 MED ORDER — PHENYLEPHRINE HCL 10 MG/ML IJ SOLN
INTRAMUSCULAR | Status: DC | PRN
  Administered 2014-12-15 (×3): 40 ug via INTRAVENOUS

## 2014-12-15 MED ORDER — HYDROMORPHONE HCL 1 MG/ML IJ SOLN: 0.2500 mg | INTRAMUSCULAR | Status: DC | PRN

## 2014-12-15 MED ORDER — SODIUM CHLORIDE 0.9 % IJ SOLN: 3.0000 mL | INTRAMUSCULAR | Status: DC | PRN

## 2014-12-15 MED ORDER — ACETAMINOPHEN 325 MG PO TABS: 650.0000 mg | ORAL_TABLET | ORAL | Status: DC | PRN

## 2014-12-15 MED ORDER — DEXAMETHASONE SODIUM PHOSPHATE 10 MG/ML IJ SOLN
INTRAMUSCULAR | Status: AC
  Filled 2014-12-15: qty 1

## 2014-12-15 MED ORDER — PANTOPRAZOLE SODIUM 40 MG IV SOLR: 40.0000 mg | Freq: Every day | INTRAVENOUS | Status: DC

## 2014-12-15 MED ORDER — THROMBIN 5000 UNITS EX SOLR
CUTANEOUS | Status: DC | PRN
  Administered 2014-12-15 (×2): 5000 [IU] via TOPICAL

## 2014-12-15 MED ORDER — HYDROMORPHONE HCL 1 MG/ML IJ SOLN: 0.5000 mg | INTRAMUSCULAR | Status: DC | PRN

## 2014-12-15 MED ORDER — NEOSTIGMINE METHYLSULFATE 10 MG/10ML IV SOLN
INTRAVENOUS | Status: AC
  Filled 2014-12-15: qty 1

## 2014-12-15 MED ORDER — DEXAMETHASONE SODIUM PHOSPHATE 10 MG/ML IJ SOLN
INTRAMUSCULAR | Status: DC | PRN
  Administered 2014-12-15: 10 mg via INTRAVENOUS

## 2014-12-15 MED ORDER — NEOSTIGMINE METHYLSULFATE 10 MG/10ML IV SOLN
INTRAVENOUS | Status: DC | PRN
  Administered 2014-12-15: 3 mg via INTRAVENOUS

## 2014-12-15 MED ORDER — ROCURONIUM BROMIDE 50 MG/5ML IV SOLN
INTRAVENOUS | Status: AC
  Filled 2014-12-15: qty 1

## 2014-12-15 MED ORDER — KCL IN DEXTROSE-NACL 20-5-0.45 MEQ/L-%-% IV SOLN
INTRAVENOUS | Status: DC
  Filled 2014-12-15 (×2): qty 1000

## 2014-12-15 MED ORDER — CEFAZOLIN SODIUM-DEXTROSE 2-3 GM-% IV SOLR
INTRAVENOUS | Status: DC | PRN
  Administered 2014-12-15: 2 g via INTRAVENOUS

## 2014-12-15 MED ORDER — FENTANYL CITRATE (PF) 100 MCG/2ML IJ SOLN
INTRAMUSCULAR | Status: DC | PRN
  Administered 2014-12-15: 100 ug via INTRAVENOUS

## 2014-12-15 MED ORDER — 0.9 % SODIUM CHLORIDE (POUR BTL) OPTIME
TOPICAL | Status: DC | PRN
  Administered 2014-12-15: 1000 mL

## 2014-12-15 MED ORDER — HEMOSTATIC AGENTS (NO CHARGE) OPTIME
TOPICAL | Status: DC | PRN
  Administered 2014-12-15: 1 via TOPICAL

## 2014-12-15 MED ORDER — PROPOFOL 10 MG/ML IV BOLUS
INTRAVENOUS | Status: AC
  Filled 2014-12-15: qty 20

## 2014-12-15 MED ORDER — PHENYLEPHRINE 40 MCG/ML (10ML) SYRINGE FOR IV PUSH (FOR BLOOD PRESSURE SUPPORT)
PREFILLED_SYRINGE | INTRAVENOUS | Status: AC
  Filled 2014-12-15: qty 10

## 2014-12-15 MED ORDER — ACETAMINOPHEN 650 MG RE SUPP: 650.0000 mg | RECTAL | Status: DC | PRN

## 2014-12-15 MED ORDER — FLEET ENEMA 7-19 GM/118ML RE ENEM: 1.0000 | ENEMA | Freq: Once | RECTAL | Status: DC | PRN

## 2014-12-15 MED ORDER — PROPOFOL 10 MG/ML IV BOLUS
INTRAVENOUS | Status: DC | PRN
  Administered 2014-12-15: 50 mg via INTRAVENOUS
  Administered 2014-12-15: 150 mg via INTRAVENOUS

## 2014-12-15 MED ORDER — GLYCOPYRROLATE 0.2 MG/ML IJ SOLN
INTRAMUSCULAR | Status: DC | PRN
  Administered 2014-12-15: 0.4 mg via INTRAVENOUS

## 2014-12-15 MED ORDER — LIDOCAINE HCL 4 % MT SOLN
OROMUCOSAL | Status: DC | PRN
  Administered 2014-12-15: 2 mL via TOPICAL

## 2014-12-15 MED ORDER — PROMETHAZINE HCL 25 MG/ML IJ SOLN
INTRAMUSCULAR | Status: AC
  Filled 2014-12-15: qty 1

## 2014-12-15 MED ORDER — DEXTROSE 5 % IV SOLN
500.0000 mg | Freq: Four times a day (QID) | INTRAVENOUS | Status: DC | PRN
  Filled 2014-12-15: qty 5

## 2014-12-15 MED ORDER — FENTANYL CITRATE (PF) 250 MCG/5ML IJ SOLN
INTRAMUSCULAR | Status: AC
  Filled 2014-12-15: qty 5

## 2014-12-15 MED ORDER — MIDAZOLAM HCL 5 MG/5ML IJ SOLN
INTRAMUSCULAR | Status: DC | PRN
  Administered 2014-12-15: 2 mg via INTRAVENOUS

## 2014-12-15 MED ORDER — CEFAZOLIN SODIUM 1-5 GM-% IV SOLN
1.0000 g | Freq: Three times a day (TID) | INTRAVENOUS | Status: DC
  Filled 2014-12-15 (×2): qty 50

## 2014-12-15 MED ORDER — NAPROXEN SODIUM 220 MG PO TABS: 220.0000 mg | ORAL_TABLET | Freq: Two times a day (BID) | ORAL | Status: DC

## 2014-12-15 MED ORDER — FENTANYL CITRATE (PF) 100 MCG/2ML IJ SOLN
INTRAMUSCULAR | Status: DC | PRN
  Administered 2014-12-15: 50 ug via INTRAVENOUS
  Administered 2014-12-15: 100 ug via INTRAVENOUS
  Administered 2014-12-15: 50 ug via INTRAVENOUS

## 2014-12-15 MED ORDER — ONDANSETRON HCL 4 MG/2ML IJ SOLN
INTRAMUSCULAR | Status: DC | PRN
  Administered 2014-12-15: 4 mg via INTRAVENOUS

## 2014-12-15 MED ORDER — LIDOCAINE HCL (CARDIAC) 20 MG/ML IV SOLN
INTRAVENOUS | Status: DC | PRN
  Administered 2014-12-15: 60 mg via INTRAVENOUS

## 2014-12-15 MED ORDER — MUPIROCIN 2 % EX OINT
1.0000 "application " | TOPICAL_OINTMENT | Freq: Once | CUTANEOUS | Status: AC
  Administered 2014-12-15: 1 via TOPICAL
  Filled 2014-12-15: qty 22

## 2014-12-15 MED ORDER — BUPIVACAINE HCL (PF) 0.5 % IJ SOLN
INTRAMUSCULAR | Status: DC | PRN
  Administered 2014-12-15: 5 mL

## 2014-12-15 MED ORDER — LIDOCAINE-EPINEPHRINE 1 %-1:100000 IJ SOLN
INTRAMUSCULAR | Status: DC | PRN
  Administered 2014-12-15: 5 mL

## 2014-12-15 SURGICAL SUPPLY — 57 items
ADH SKN CLS APL DERMABOND .7 (GAUZE/BANDAGES/DRESSINGS) ×1
APL SKNCLS STERI-STRIP NONHPOA (GAUZE/BANDAGES/DRESSINGS)
BENZOIN TINCTURE PRP APPL 2/3 (GAUZE/BANDAGES/DRESSINGS) IMPLANT
BIT DRILL NEURO 2X3.1 SFT TUCH (MISCELLANEOUS) ×1 IMPLANT
BLADE CLIPPER SURG (BLADE) IMPLANT
BUR ROUND FLUTED 5 RND (BURR) ×2 IMPLANT
BUR ROUND FLUTED 5MM RND (BURR) ×1
CANISTER SUCT 3000ML PPV (MISCELLANEOUS) ×3 IMPLANT
CLOSURE WOUND 1/2 X4 (GAUZE/BANDAGES/DRESSINGS)
DECANTER SPIKE VIAL GLASS SM (MISCELLANEOUS) ×3 IMPLANT
DERMABOND ADVANCED (GAUZE/BANDAGES/DRESSINGS) ×2
DERMABOND ADVANCED .7 DNX12 (GAUZE/BANDAGES/DRESSINGS) ×1 IMPLANT
DRAPE LAPAROTOMY 100X72X124 (DRAPES) ×3 IMPLANT
DRAPE MICROSCOPE LEICA (MISCELLANEOUS) ×3 IMPLANT
DRAPE POUCH INSTRU U-SHP 10X18 (DRAPES) ×3 IMPLANT
DRAPE SURG 17X23 STRL (DRAPES) ×3 IMPLANT
DRILL NEURO 2X3.1 SOFT TOUCH (MISCELLANEOUS) ×3
DRSG OPSITE POSTOP 3X4 (GAUZE/BANDAGES/DRESSINGS) ×3 IMPLANT
DURAPREP 26ML APPLICATOR (WOUND CARE) ×3 IMPLANT
ELECT REM PT RETURN 9FT ADLT (ELECTROSURGICAL) ×3
ELECTRODE REM PT RTRN 9FT ADLT (ELECTROSURGICAL) ×1 IMPLANT
GAUZE SPONGE 4X4 12PLY STRL (GAUZE/BANDAGES/DRESSINGS) IMPLANT
GAUZE SPONGE 4X4 16PLY XRAY LF (GAUZE/BANDAGES/DRESSINGS) IMPLANT
GLOVE BIO SURGEON STRL SZ8 (GLOVE) ×6 IMPLANT
GLOVE BIO SURGEON STRL SZ8.5 (GLOVE) ×3 IMPLANT
GLOVE BIOGEL PI IND STRL 8 (GLOVE) ×1 IMPLANT
GLOVE BIOGEL PI IND STRL 8.5 (GLOVE) ×1 IMPLANT
GLOVE BIOGEL PI INDICATOR 8 (GLOVE) ×2
GLOVE BIOGEL PI INDICATOR 8.5 (GLOVE) ×2
GLOVE ECLIPSE 8.0 STRL XLNG CF (GLOVE) ×3 IMPLANT
GLOVE EXAM NITRILE LRG STRL (GLOVE) IMPLANT
GLOVE EXAM NITRILE MD LF STRL (GLOVE) IMPLANT
GLOVE EXAM NITRILE XL STR (GLOVE) IMPLANT
GLOVE EXAM NITRILE XS STR PU (GLOVE) IMPLANT
GOWN STRL REUS W/ TWL LRG LVL3 (GOWN DISPOSABLE) IMPLANT
GOWN STRL REUS W/ TWL XL LVL3 (GOWN DISPOSABLE) ×1 IMPLANT
GOWN STRL REUS W/TWL 2XL LVL3 (GOWN DISPOSABLE) ×3 IMPLANT
GOWN STRL REUS W/TWL LRG LVL3 (GOWN DISPOSABLE)
GOWN STRL REUS W/TWL XL LVL3 (GOWN DISPOSABLE) ×3
KIT BASIN OR (CUSTOM PROCEDURE TRAY) ×3 IMPLANT
KIT ROOM TURNOVER OR (KITS) ×3 IMPLANT
NEEDLE HYPO 18GX1.5 BLUNT FILL (NEEDLE) IMPLANT
NEEDLE HYPO 25X1 1.5 SAFETY (NEEDLE) ×3 IMPLANT
NS IRRIG 1000ML POUR BTL (IV SOLUTION) ×3 IMPLANT
PACK LAMINECTOMY NEURO (CUSTOM PROCEDURE TRAY) ×3 IMPLANT
PAD ARMBOARD 7.5X6 YLW CONV (MISCELLANEOUS) ×9 IMPLANT
RUBBERBAND STERILE (MISCELLANEOUS) ×6 IMPLANT
SPONGE SURGIFOAM ABS GEL SZ50 (HEMOSTASIS) ×3 IMPLANT
STRIP CLOSURE SKIN 1/2X4 (GAUZE/BANDAGES/DRESSINGS) IMPLANT
SUT VIC AB 0 CT1 18XCR BRD8 (SUTURE) ×1 IMPLANT
SUT VIC AB 0 CT1 8-18 (SUTURE) ×3
SUT VIC AB 2-0 CT1 18 (SUTURE) ×3 IMPLANT
SUT VIC AB 3-0 SH 8-18 (SUTURE) ×3 IMPLANT
SYR 5ML LL (SYRINGE) IMPLANT
TOWEL OR 17X24 6PK STRL BLUE (TOWEL DISPOSABLE) ×3 IMPLANT
TOWEL OR 17X26 10 PK STRL BLUE (TOWEL DISPOSABLE) ×3 IMPLANT
WATER STERILE IRR 1000ML POUR (IV SOLUTION) ×3 IMPLANT

## 2014-12-15 NOTE — Anesthesia Preprocedure Evaluation (Addendum)
Anesthesia Evaluation  Patient identified by MRN, date of birth, ID band Patient awake    Reviewed: Allergy & Precautions, NPO status , Patient's Chart, lab work & pertinent test results  History of Anesthesia Complications Negative for: history of anesthetic complications  Airway Mallampati: II  TM Distance: >3 FB Neck ROM: Full    Dental  (+) Teeth Intact   Pulmonary neg pulmonary ROS,    Pulmonary exam normal        Cardiovascular + Peripheral Vascular Disease  Normal cardiovascular exam+ dysrhythmias Supra Ventricular Tachycardia      Neuro/Psych negative psych ROS   GI/Hepatic negative GI ROS, Neg liver ROS,   Endo/Other  negative endocrine ROS  Renal/GU negative Renal ROS     Musculoskeletal   Abdominal   Peds  Hematology   Anesthesia Other Findings   Reproductive/Obstetrics                            Anesthesia Physical Anesthesia Plan  ASA: II  Anesthesia Plan: General   Post-op Pain Management:    Induction: Intravenous  Airway Management Planned: Oral ETT  Additional Equipment:   Intra-op Plan:   Post-operative Plan: Extubation in OR  Informed Consent: I have reviewed the patients History and Physical, chart, labs and discussed the procedure including the risks, benefits and alternatives for the proposed anesthesia with the patient or authorized representative who has indicated his/her understanding and acceptance.   Dental advisory given  Plan Discussed with: CRNA, Anesthesiologist and Surgeon  Anesthesia Plan Comments:        Anesthesia Quick Evaluation

## 2014-12-15 NOTE — Anesthesia Postprocedure Evaluation (Signed)
Anesthesia Post Note  Patient: [name]  Procedure(s) Performed: Procedure(s) (LRB): Left Lumbar 5-Sacral 1 Microdiskectomy (Left)  Anesthesia type: general  Patient location: PACU  Post pain: Pain level controlled  Post assessment: Patient's Cardiovascular Status Stable  Last Vitals:  Filed Vitals:   12/15/14 1259  BP: 97/56  Pulse: 50  Temp:   Resp: 18    Post vital signs: Reviewed and stable  Level of consciousness: sedated  Complications: No apparent anesthesia complications

## 2014-12-15 NOTE — Plan of Care (Signed)
Problem: Consults Goal: Diagnosis - Spinal Surgery Outcome: Completed/Met Date Met:  12/15/14 Microdiscectomy

## 2014-12-15 NOTE — Discharge Summary (Signed)
Physician Discharge Summary  Patient ID: [name] MRN: [id number] DOB/AGE: [birth year] 44 y.o.  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  Following uncomplicated left F3-O3 microdiscectomy, she recovered nicely and transferred to [facility] for nursing observation.  She has mobilized well, with resolution of left plantar flexion weakness.   Consults: None  Significant Diagnostic Studies: radiology: X-Ray: intra-operative  Treatments: surgery: Left Lumbar 5-Sacral 1 Microdiskectomy (Left) - Left L5-S1 Microdiskectomy   Discharge Exam: Blood pressure 107/69, pulse 51, temperature 98.2 F (36.8 C), temperature source Oral, resp. rate 13, height 5' 8"  (1.727 m), weight 61.236 kg (135 lb), SpO2 100 %, not currently breastfeeding.  Postoperatively, incision was flat, without erythema, swelling, or drainage beneath honeycomb and Dermabond. No reported changes. Pt has ambulated with nurse and physical therapist. Full strength BLE.    Disposition: 01-Home or Self Care  Pt verbalizes understanding of d/c instructions. She has Tramadol for prn use at home. Tizanidine 51m 1 po q6hrs prn spasm #50 will be called to her pharmacy. She will f/u in office Sept 19th, calling any time with questions or concerns.      Medication List    ASK your doctor about these medications        acetaminophen 325 MG tablet  Commonly known as:  TYLENOL  Take 325 mg by  mouth once.     ibuprofen 600 MG tablet  Commonly known as:  ADVIL,MOTRIN  Take 1 tablet (600 mg total) by mouth every 6 (six) hours.     naproxen sodium 220 MG tablet  Commonly known as:  ANAPROX  Take 220 mg by mouth 2 (two) times daily with a meal.     prenatal multivitamin Tabs tablet  Take 1 tablet by mouth daily at 12 noon.         Signed: PVerdis Prime9/11/2014, 5:34 PM

## 2014-12-15 NOTE — Progress Notes (Signed)
Patient alert and oriented, mae's well, voiding adequate amount of urine, swallowing without difficulty, no c/o pain. Patient discharged home with family. Script and discharged instructions given to patient. Patient and family stated understanding of d/c instructions given and has an appointment with MD. 

## 2014-12-15 NOTE — Evaluation (Signed)
Physical Therapy Evaluation and Discharge Patient Details Name: [name] MRN: [id number] DOB: Oct 30, 1970 Today's Date: 12/15/2014   History of Present Illness  Left Lumbar 5-Sacral 1 Microdiskectomy   Clinical Impression  Patient evaluated by Physical Therapy with no further acute PT needs identified. All education has been completed and the patient has no further questions. Demonstrates good dynamic stability without loss of balance during ambulation. No buckling of LEs. [name] does have 4/5 strength of left quad and is unable to bear full weight on left plantar flexed ankle. Grossly 5/5 RLE. She reports however, that this feels improved (especially return of sensation) compared to signs/symptoms that were present pre-op. See below for any follow-up Physial Therapy or equipment needs. PT is signing off. Thank you for this referral.     Follow Up Recommendations Outpatient PT (when cleared by MD)    Equipment Recommendations  None recommended by PT    Recommendations for Other Services       Precautions / Restrictions Precautions Precautions: Back (No order, taught for comfort) Precaution Booklet Issued: Yes (comment) Precaution Comments: reviewed for comfort Restrictions Weight Bearing Restrictions: No      Mobility  Bed Mobility               General bed mobility comments: Declines practice  Transfers Overall transfer level: Independent                  Ambulation/Gait Ambulation/Gait assistance: Modified independent (Device/Increase time) Ambulation Distance (Feet): 150 Feet Assistive device: None Gait Pattern/deviations: Decreased stride length (Decreased push-off Lt foot) Gait velocity: decreased Gait velocity interpretation: Below normal speed for age/gender General Gait Details: Decreased push-off on Lt but pt reports this is better than pre-op. No loss of balance noted, pt seems slightly guarded but does not require any  assist.  Stairs            Wheelchair Mobility    Modified Rankin (Stroke Patients Only)       Balance Overall balance assessment: Independent                                           Pertinent Vitals/Pain Pain Assessment: 0-10 Pain Score:  (No numerical value given) Pain Descriptors / Indicators: Grimacing;Guarding Pain Intervention(s): Monitored during session;Repositioned    Home Living Family/patient expects to be discharged to:: Private residence Living Arrangements: Spouse/significant other;Children Available Help at Discharge: Family;Available PRN/intermittently Type of Home: House         Home Equipment: None      Prior Function Level of Independence: Independent               Hand Dominance        Extremity/Trunk Assessment   Upper Extremity Assessment: Defer to OT evaluation           Lower Extremity Assessment: LLE deficits/detail   LLE Deficits / Details: 4/5 knee extension. Unable to bear full weight on plantar flexed ankle.     Communication   Communication: No difficulties  Cognition Arousal/Alertness: Awake/alert Behavior During Therapy: WFL for tasks assessed/performed Overall Cognitive Status: Within Functional Limits for tasks assessed                      General Comments General comments (skin integrity, edema, etc.): Reports sensation has improved in LLE. Still with decreased Lt knee extension and  ankle plantar flexion    Exercises General Exercises - Lower Extremity Toe Raises: Strengthening;Both;10 reps;Standing Heel Raises: Strengthening;Both;5 reps;Standing      Assessment/Plan    PT Assessment Patent does not need any further PT services  PT Diagnosis Abnormality of gait;Acute pain   PT Problem List    PT Treatment Interventions     PT Goals (Current goals can be found in the Care Plan section) Acute Rehab PT Goals Patient Stated Goal: Be able to run again, but if not then  to be able to swim PT Goal Formulation: All assessment and education complete, DC therapy    Frequency     Barriers to discharge        Co-evaluation               End of Session   Activity Tolerance: Patient tolerated treatment well Patient left: in chair;with family/visitor present Nurse Communication: Mobility status    Functional Assessment Tool Used: Clinical Observation Functional Limitation: Mobility: Walking and moving around Mobility: Walking and Moving Around Current Status (B6389): At least 1 percent but less than 20 percent impaired, limited or restricted Mobility: Walking and Moving Around Goal Status [phone]): At least 1 percent but less than 20 percent impaired, limited or restricted Mobility: Walking and Moving Around Discharge Status [phone]): At least 1 percent but less than 20 percent impaired, limited or restricted    Time: 1572-6203 PT Time Calculation (min) (ACUTE ONLY): 8 min   Charges:   PT Evaluation $Initial PT Evaluation Tier I: 1 Procedure     PT G Codes:   PT G-Codes **NOT FOR INPATIENT CLASS** Functional Assessment Tool Used: Clinical Observation Functional Limitation: Mobility: Walking and moving around Mobility: Walking and Moving Around Current Status ([id number]): At least 1 percent but less than 20 percent impaired, limited or restricted Mobility: Walking and Moving Around Goal Status [phone]): At least 1 percent but less than 20 percent impaired, limited or restricted Mobility: Walking and Moving Around Discharge Status [phone]): At least 1 percent but less than 20 percent impaired, limited or restricted    Ellouise Newer 12/15/2014, 5:26 PM Camille Bal Mun[place], [place]

## 2014-12-15 NOTE — Op Note (Signed)
12/15/2014  12:28 PM  PATIENT:  [name]  44 y.o. 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, DDD, radiculopathy  PROCEDURE:  Procedure(s) with comments: Left Lumbar 5-Sacral 1 Microdiskectomy (Left) - Left L5-S1 Microdiskectomy  SURGEON:  Surgeon(s) and Role:    * Erline Levine, MD - Primary    * Newman Pies, MD - Assisting  PHYSICIAN ASSISTANT:   ASSISTANTS: Poteat, RN   ANESTHESIA:   general  EBL:  Total I/O In: 1000 [I.V.:1000] Out: 25 [Blood:25]  BLOOD ADMINISTERED:none  DRAINS: none   LOCAL MEDICATIONS USED:  LIDOCAINE   SPECIMEN:  No Specimen  DISPOSITION OF SPECIMEN:  N/A  COUNTS:  YES  TOURNIQUET:  * No tourniquets in log *  DICTATION: DICTATION: Patient has a large L 5 S1 disc rupture on the left with significant left leg weakness. It was elected to take her to surgery for left L 5 S 1  microdiscectomy.  Procedure: Patient was brought to the operating room and following the smooth and uncomplicated induction of general endotracheal anesthesia she was placed in a prone position on the Wilson frame. Low back was prepped and draped in the usual sterile fashion with betadine scrub and DuraPrep. Area of planned incision was infiltrated with local lidocaine. Incision was made in the midline and carried to the lumbodorsal fascia which was incised on the left side of midline. Subperiosteal dissection was performed exposing what was felt to be L 5 S 1 level. Intraoperative x-ray demonstrated marker probe at L5S1. A hemi-semi-laminectomy of L5 was performed a high-speed drill and completed with Kerrison rongeurs and a generous foraminotomy was performed overlying the superior aspect of the S 1 lamina. Ligamentum flavum was detached and removed in a piecemeal fashion and the S 1 nerve root  was decompressed laterally with removal of the superior aspect of the facet and ligamentum causing nerve root compression. The microscope was brought into the field and the S 1 nerve root was mobilized medially. This exposed a large amount of soft disc material and a free fragment of herniated disc material. Multiple fragments were removed and these extended into the interspace which appeared to be quite soft with a disrupted annulus overlying the interspace. As a result it was elected to further decompress the interspace and remove loose disc material and this was done with a variety of pituitary rongeurs. The redundant annulus was also removed with 2 mm Kerrison rongeur.  At this point it was felt that all neural elements were well decompressed and there was no evidence of residual loose disc material within the interspace. The interspace was then irrigated with saline and no additional disc material was mobilized. Hemostasis was assured with bipolar electrocautery and the interspace was irrigated with Depo-Medrol and fentanyl. The lumbodorsal fascia was closed with 0 Vicryl sutures the subcutaneous tissues reapproximated 2-0 Vicryl inverted sutures and the skin edges were reapproximated with 3-0 Vicryl subcuticular stitch. The wound is dressed with Dermabond and an occlusive dressing. Patient was extubated in the operating room and taken to recovery in stable and satisfactory condition having tolerated her operation well counts were correct at the end of the case.  PLAN OF CARE: Admit for overnight observation  PATIENT DISPOSITION:  PACU - hemodynamically stable.   Delay start of Pharmacological VTE agent (>24hrs) due to surgical blood loss  or risk of bleeding: yes

## 2014-12-15 NOTE — Transfer of Care (Signed)
Immediate Anesthesia Transfer of Care Note  Patient: [name]  Procedure(s) Performed: Procedure(s) with comments: Left Lumbar 5-Sacral 1 Microdiskectomy (Left) - Left L5-S1 Microdiskectomy  Patient Location: PACU  Anesthesia Type:General  Level of Consciousness: awake, alert , oriented, patient cooperative and responds to stimulation  Airway & Oxygen Therapy: Patient Spontanous Breathing and Patient connected to nasal cannula oxygen  Post-op Assessment: Report given to RN, Post -op Vital signs reviewed and stable and Patient moving all extremities X 4  Post vital signs: Reviewed and stable  Last Vitals:  Filed Vitals:   12/15/14 1227  BP: 105/53  Pulse:   Temp:   Resp:     Complications: No apparent anesthesia complications

## 2014-12-15 NOTE — Brief Op Note (Signed)
12/15/2014  12:28 PM  PATIENT:  [name]  44 y.o. 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, DDD, radiculopathy  PROCEDURE:  Procedure(s) with comments: Left Lumbar 5-Sacral 1 Microdiskectomy (Left) - Left L5-S1 Microdiskectomy  SURGEON:  Surgeon(s) and Role:    * Erline Levine, MD - Primary    * Newman Pies, MD - Assisting  PHYSICIAN ASSISTANT:   ASSISTANTS: Poteat, RN   ANESTHESIA:   general  EBL:  Total I/O In: 1000 [I.V.:1000] Out: 25 [Blood:25]  BLOOD ADMINISTERED:none  DRAINS: none   LOCAL MEDICATIONS USED:  LIDOCAINE   SPECIMEN:  No Specimen  DISPOSITION OF SPECIMEN:  N/A  COUNTS:  YES  TOURNIQUET:  * No tourniquets in log *  DICTATION: DICTATION: Patient has a large L 5 S1 disc rupture on the left with significant left leg weakness. It was elected to take her to surgery for left L 5 S 1  microdiscectomy.  Procedure: Patient was brought to the operating room and following the smooth and uncomplicated induction of general endotracheal anesthesia she was placed in a prone position on the Wilson frame. Low back was prepped and draped in the usual sterile fashion with betadine scrub and DuraPrep. Area of planned incision was infiltrated with local lidocaine. Incision was made in the midline and carried to the lumbodorsal fascia which was incised on the left side of midline. Subperiosteal dissection was performed exposing what was felt to be L 5 S 1 level. Intraoperative x-ray demonstrated marker probe at L5S1. A hemi-semi-laminectomy of L5 was performed a high-speed drill and completed with Kerrison rongeurs and a generous foraminotomy was performed overlying the superior aspect of the S 1 lamina. Ligamentum flavum was detached and removed in a piecemeal fashion and the S 1 nerve root  was decompressed laterally with removal of the superior aspect of the facet and ligamentum causing nerve root compression. The microscope was brought into the field and the S 1 nerve root was mobilized medially. This exposed a large amount of soft disc material and a free fragment of herniated disc material. Multiple fragments were removed and these extended into the interspace which appeared to be quite soft with a disrupted annulus overlying the interspace. As a result it was elected to further decompress the interspace and remove loose disc material and this was done with a variety of pituitary rongeurs. The redundant annulus was also removed with 2 mm Kerrison rongeur.  At this point it was felt that all neural elements were well decompressed and there was no evidence of residual loose disc material within the interspace. The interspace was then irrigated with saline and no additional disc material was mobilized. Hemostasis was assured with bipolar electrocautery and the interspace was irrigated with Depo-Medrol and fentanyl. The lumbodorsal fascia was closed with 0 Vicryl sutures the subcutaneous tissues reapproximated 2-0 Vicryl inverted sutures and the skin edges were reapproximated with 3-0 Vicryl subcuticular stitch. The wound is dressed with Dermabond and an occlusive dressing. Patient was extubated in the operating room and taken to recovery in stable and satisfactory condition having tolerated her operation well counts were correct at the end of the case.  PLAN OF CARE: Admit for overnight observation  PATIENT DISPOSITION:  PACU - hemodynamically stable.   Delay start of Pharmacological VTE agent (>24hrs) due to surgical blood loss  or risk of bleeding: yes

## 2014-12-15 NOTE — H&P (Signed)
Patient ID:   [id number] Patient: [name]  Date of Birth: [birth year] Visit Type: Office Visit   Date: 12/14/2014 08:15 AM Provider: Marchia Meiers. Vertell Limber MD   This 44 year old female presents for Weakness.  History of Present Illness: 1.  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  Since that time a steroid Dosepak has relieved a significant amount of pain, however weakness persists in the left foot.  Patient feels as though she drags her left foot and is unable to stand tiptoe on the left foot.   Medrol Dosepak ends today  History: Healthy Surgical history: D&C June 2014 for miscarriage  MRI on [facility]  MRI shows a large disc herniation at L5-S1 on the left.  There appears to be a caudally migrated free fragment which is causing significant left S1 nerve root compression.  There are some spondylosis and degenerative changes at the L5-S1 level.  The other disc levels appear to be normal.  The patient is having a great deal of pain and is not able to push off on her left foot.  She notes significant numbness in her left foot.  She is not able to get relief.  As a steroid taper is wearing off the pain is returning and is quite intense.  She is not able to stand on her left leg.  She is not able to sit squarely on a chair and has to lean to the right.  She is not able to rest at night.       PAST MEDICAL HISTORY, SURGICAL HISTORY, FAMILY HISTORY, SOCIAL HISTORY AND REVIEW OF SYSTEMS I have reviewed the patient's past medical, surgical, family and social history as well as the comprehensive review of systems as included on the [facility] history form dated, which I have signed.   MEDICATIONS(added, continued or stopped this visit):   ALLERGIES:   REVIEW OF SYSTEMS System Neg/Pos Details   Constitutional Negative Chills, fatigue, fever, malaise, night sweats, weight gain and weight loss.  ENMT Negative Ear drainage, hearing loss, nasal drainage, otalgia, sinus pressure and sore throat.  Eyes Negative Eye discharge, eye pain and vision changes.  Respiratory Negative Chronic cough, cough, dyspnea, known TB exposure and wheezing.  Cardio Negative Chest pain, claudication, edema and irregular heartbeat/palpitations.  GI Negative Abdominal pain, blood in stool, change in stool pattern, constipation, decreased appetite, diarrhea, heartburn, nausea and vomiting.  GU Negative Dysuria, hematuria, hot flashes, irregular menses, polyuria, urinary frequency, urinary incontinence and urinary retention.  Endocrine Negative Cold intolerance, heat intolerance, polydipsia and polyphagia.  Neuro Positive Extremity weakness, Gait disturbance, Numbness in extremity.  Psych Negative Anxiety, depression and insomnia.  Integumentary Negative Brittle hair, brittle nails, change in shape/size of mole(s), hair loss, hirsutism, hives, pruritus, rash and skin lesion.  MS Positive RLE pain.  Hema/Lymph Negative Easy bleeding, easy bruising and lymphadenopathy.  Allergic/Immuno Negative Contact allergy, environmental allergies, food allergies and seasonal allergies.  Reproductive Negative Breast discharge, breast lump(s), dysmenorrhea, dyspareunia, history of abnormal PAP smear and vaginal discharge.     Vitals Date Temp F BP Pulse Ht In Wt Lb BMI BSA Pain Score  12/14/2014  118/77 82          PHYSICAL EXAM General Level of Distress: no acute distress Overall Appearance: normal    Cardiovascular Cardiac: regular rate and rhythm  without murmur  Respiratory Lungs: clear to auscultation  Neurological Recent and Remote Memory: normal Attention Span and Concentration:   normal Language: normal Fund of Knowledge: normal  Right Left Sensation: normal normal Upper Extremity  Coordination: normal normal  Lower Extremity Coordination: normal normal  Musculoskeletal Gait and Station: normal  Right Left Upper Extremity Muscle Strength: normal normal Lower Extremity Muscle Strength: normal normal Upper Extremity Muscle Tone:  normal normal Lower Extremity Muscle Tone: normal normal  Motor Strength Upper and lower extremity motor strength was tested in the clinically pertinent muscles. Any abnormal findings will be noted below.   Right Left Medial Gastroc:  4/5   Deep Tendon Reflexes  Right Left Biceps: normal normal Triceps: normal normal Brachiloradialis: normal normal Patellar: normal normal Achilles: normal absent  Sensory Sensation was tested at L1 to S1. Any abnormal findings will be noted below.  Right Left L5:  normal  S1:  decreased  Cranial Nerves II. Optic Nerve/Visual Fields: normal III. Oculomotor: normal IV. Trochlear: normal V. Trigeminal: normal VI. Abducens: normal VII. Facial: normal VIII. Acoustic/Vestibular: normal IX. Glossopharyngeal: normal X. Vagus: normal XI. Spinal Accessory: normal XII. Hypoglossal: normal  Motor and other Tests Lhermittes: negative Rhomberg: negative    Right Left Hoffman's: normal normal Clonus: normal normal Babinski: normal normal SLR: negative positive at 20 degrees Patrick's Corky Sox): negative negative Toe Walk: normal normal Toe Lift: normal normal Heel Walk: normal normal SI Joint: nontender nontender   Additional Findings:  Patient is not able to stand on her left leg.  She has decreased plantar flexion on the left.  She is able to stand on her toes on the right.    IMPRESSION Left L5-S1 herniated lumbar disc with left S1 radiculopathy with left leg weakness in plantarflexion, absent ankle jerk, positive straight leg raise  Assessment/Plan # Detail Type Description   1. Assessment Low back pain, unspecified back pain laterality, with sciatica presence unspecified  (M54.5).       2. Assessment Spondylosis of lumbosacral region without myelopathy or radiculopathy (M47.817).       3. Assessment Herniated nucleus pulposus, lumbar (M51.26).       4. Assessment Lumbar radiculopathy (M54.16).         Left L5-S1 microdiscectomy on urgent basis.  Risks and benefits were discussed in detail with the patient and she wishes to proceed with surgery.  Orders: Diagnostic Procedures: Assessment Procedure  M51.26 Microdiscectomy - L5-S1 - left             Provider:  Marchia Meiers. Vertell Limber MD  12/14/2014 09:37 AM Dictation edited by: Marchia Meiers. Vertell Limber    CC Providers: Erline Levine MD [address] [place], [place] [address]              Electronically signed by Marchia Meiers. Vertell Limber MD on 12/14/2014 09:38 AM

## 2014-12-15 NOTE — Anesthesia Procedure Notes (Signed)
Procedure Name: Intubation Performed by: Judeth Cornfield T Pre-anesthesia Checklist: Patient identified, Emergency Drugs available, Suction available, Patient being monitored and Timeout performed Patient Re-evaluated:Patient Re-evaluated prior to inductionOxygen Delivery Method: Circle system utilized Preoxygenation: Pre-oxygenation with 100% oxygen Intubation Type: IV induction Ventilation: Mask ventilation without difficulty Laryngoscope Size: Mac and 3 Grade View: Grade I Tube type: Oral Tube size: 7.0 mm Number of attempts: 1 Airway Equipment and Method: Stylet Placement Confirmation: ETT inserted through vocal cords under direct vision,  breath sounds checked- equal and bilateral,  positive ETCO2 and CO2 detector Secured at: 22 cm Tube secured with: Tape Dental Injury: Teeth and Oropharynx as per pre-operative assessment

## 2014-12-16 ENCOUNTER — Encounter (HOSPITAL_COMMUNITY): Payer: Self-pay | Admitting: Neurosurgery

## 2015-10-16 DIAGNOSIS — Z1231 Encounter for screening mammogram for malignant neoplasm of breast: Secondary | ICD-10-CM | POA: Diagnosis not present

## 2015-10-17 LAB — HM MAMMOGRAPHY: HM Mammogram: NORMAL (ref 0–4)

## 2015-10-31 DIAGNOSIS — D2330 Other benign neoplasm of skin of unspecified part of face: Secondary | ICD-10-CM | POA: Diagnosis not present

## 2015-10-31 DIAGNOSIS — L821 Other seborrheic keratosis: Secondary | ICD-10-CM | POA: Diagnosis not present

## 2015-10-31 DIAGNOSIS — L820 Inflamed seborrheic keratosis: Secondary | ICD-10-CM | POA: Diagnosis not present

## 2015-10-31 DIAGNOSIS — Z86018 Personal history of other benign neoplasm: Secondary | ICD-10-CM | POA: Diagnosis not present

## 2015-10-31 DIAGNOSIS — D2271 Melanocytic nevi of right lower limb, including hip: Secondary | ICD-10-CM | POA: Diagnosis not present

## 2015-10-31 DIAGNOSIS — L309 Dermatitis, unspecified: Secondary | ICD-10-CM | POA: Diagnosis not present

## 2015-11-02 ENCOUNTER — Ambulatory Visit: Payer: 59

## 2016-03-19 DIAGNOSIS — H5212 Myopia, left eye: Secondary | ICD-10-CM | POA: Diagnosis not present

## 2016-03-19 DIAGNOSIS — H5201 Hypermetropia, right eye: Secondary | ICD-10-CM | POA: Diagnosis not present

## 2016-03-19 DIAGNOSIS — H52223 Regular astigmatism, bilateral: Secondary | ICD-10-CM | POA: Diagnosis not present

## 2016-06-27 DIAGNOSIS — H0014 Chalazion left upper eyelid: Secondary | ICD-10-CM | POA: Diagnosis not present

## 2016-07-08 DIAGNOSIS — H0014 Chalazion left upper eyelid: Secondary | ICD-10-CM | POA: Diagnosis not present

## 2016-07-08 DIAGNOSIS — L089 Local infection of the skin and subcutaneous tissue, unspecified: Secondary | ICD-10-CM | POA: Diagnosis not present

## 2016-07-17 ENCOUNTER — Ambulatory Visit (INDEPENDENT_AMBULATORY_CARE_PROVIDER_SITE_OTHER): Payer: 59 | Admitting: Family Medicine

## 2016-07-17 ENCOUNTER — Encounter: Payer: Self-pay | Admitting: Family Medicine

## 2016-07-17 VITALS — BP 102/69 | HR 79 | Temp 97.9°F | Resp 16 | Ht 68.0 in | Wt 139.0 lb

## 2016-07-17 DIAGNOSIS — Z8249 Family history of ischemic heart disease and other diseases of the circulatory system: Secondary | ICD-10-CM | POA: Insufficient documentation

## 2016-07-17 DIAGNOSIS — Z0000 Encounter for general adult medical examination without abnormal findings: Secondary | ICD-10-CM

## 2016-07-17 LAB — LIPID PANEL
Cholesterol: 139 mg/dL (ref 0–200)
HDL: 49.3 mg/dL (ref >39.00)
LDL CALC: 83 mg/dL (ref 0–99)
NONHDL: 89.87
TRIGLYCERIDES: 36 mg/dL (ref 0.0–149.0)
Total CHOL/HDL Ratio: 3
VLDL: 7.2 mg/dL (ref 0.0–40.0)

## 2016-07-17 LAB — CBC WITH DIFFERENTIAL/PLATELET
Basophils Absolute: 0 10*3/uL (ref 0.0–0.1)
Basophils Relative: 0.2 % (ref 0.0–3.0)
EOS PCT: 0.4 % (ref 0.0–5.0)
Eosinophils Absolute: 0 10*3/uL (ref 0.0–0.7)
HCT: 41.7 % (ref 36.0–46.0)
Hemoglobin: 13.8 g/dL (ref 12.0–15.0)
LYMPHS ABS: 1.9 10*3/uL (ref 0.7–4.0)
Lymphocytes Relative: 27.6 % (ref 12.0–46.0)
MCHC: 33.1 g/dL (ref 30.0–36.0)
MCV: 99.8 fl (ref 78.0–100.0)
MONOS PCT: 5.4 % (ref 3.0–12.0)
Monocytes Absolute: 0.4 10*3/uL (ref 0.1–1.0)
NEUTROS ABS: 4.7 10*3/uL (ref 1.4–7.7)
NEUTROS PCT: 66.4 % (ref 43.0–77.0)
Platelets: 198 10*3/uL (ref 150.0–400.0)
RBC: 4.17 Mil/uL (ref 3.87–5.11)
RDW: 12.7 % (ref 11.5–15.5)
WBC: 7 10*3/uL (ref 4.0–10.5)

## 2016-07-17 LAB — BASIC METABOLIC PANEL
BUN: 13 mg/dL (ref 6–23)
CHLORIDE: 104 meq/L (ref 96–112)
CO2: 26 meq/L (ref 19–32)
Calcium: 9.4 mg/dL (ref 8.4–10.5)
Creatinine, Ser: 0.8 mg/dL (ref 0.40–1.20)
GFR: 82.7 mL/min (ref >60.00)
GLUCOSE: 85 mg/dL (ref 70–99)
POTASSIUM: 4.6 meq/L (ref 3.5–5.1)
Sodium: 137 mEq/L (ref 135–145)

## 2016-07-17 LAB — VITAMIN D 25 HYDROXY (VIT D DEFICIENCY, FRACTURES): VITD: 34.29 ng/mL (ref 30.00–100.00)

## 2016-07-17 LAB — HEPATIC FUNCTION PANEL
ALK PHOS: 46 U/L (ref 39–117)
ALT: 12 U/L (ref 0–35)
AST: 17 U/L (ref 0–37)
Albumin: 4.5 g/dL (ref 3.5–5.2)
BILIRUBIN DIRECT: 0.2 mg/dL (ref 0.0–0.3)
BILIRUBIN TOTAL: 0.7 mg/dL (ref 0.2–1.2)
Total Protein: 6.5 g/dL (ref 6.0–8.3)

## 2016-07-17 LAB — TSH: TSH: 2.4 u[IU]/mL (ref 0.35–4.50)

## 2016-07-17 NOTE — Assessment & Plan Note (Signed)
Pt's mother and maternal grandparents had 'massive' MIs in their early 77s.  Pt has committed to healthy lifestyle.  Refer to cards for complete evaluation.  Pt expressed understanding and is in agreement w/ plan.

## 2016-07-17 NOTE — Patient Instructions (Signed)
Follow up in 1 year or as needed We'll notify you or your lab results and make any changes if needed Keep up the good work!  You look great! We'll call you with your cardiology appt Call with any questions or concerns Welcome!  We're glad to have you!!!

## 2016-07-17 NOTE — Progress Notes (Signed)
   Subjective:    Patient ID: [name], female    DOB: 12-05-1970, 46 y.o.   MRN: [id number]  HPI New to establish.  Previous MD- Amil Amen (years ago)  Gleed- no concerns today except for strong family hx of early cardiac issues.  Pt is questioning whether a calcium score would be beneficial.  UTD on Tdap (just not sure of date)   Review of Systems Patient reports no vision/ hearing changes, adenopathy,fever, weight change,  persistant/recurrent hoarseness , swallowing issues, chest pain, palpitations, edema, persistant/recurrent cough, hemoptysis, dyspnea (rest/exertional/paroxysmal nocturnal), gastrointestinal bleeding (melena, rectal bleeding), abdominal pain, significant heartburn, bowel changes, GU symptoms (dysuria, hematuria, incontinence), Gyn symptoms (abnormal  bleeding, pain),  syncope, focal weakness, memory loss, numbness & tingling, skin/hair/nail changes, abnormal bruising or bleeding, anxiety, or depression.     Objective:   Physical Exam General Appearance:    Alert, cooperative, no distress, appears stated age  Head:    Normocephalic, without obvious abnormality, atraumatic  Eyes:    PERRL, conjunctiva/corneas clear, EOM's intact, fundi    benign, both eyes  Ears:    Normal TM's and external ear canals, both ears  Nose:   Nares normal, septum midline, mucosa normal, no drainage    or sinus tenderness  Throat:   Lips, mucosa, and tongue normal; teeth and gums normal  Neck:   Supple, symmetrical, trachea midline, no adenopathy;    Thyroid: no enlargement/tenderness/nodules  Back:     Symmetric, no curvature, ROM normal, no CVA tenderness  Lungs:     Clear to auscultation bilaterally, respirations unlabored  Chest Wall:    No tenderness or deformity   Heart:    Regular rate and rhythm, S1 and S2 normal, no murmur, rub   or gallop  Breast Exam:    Deferred to GYN  Abdomen:     Soft, non-tender, bowel sounds active all four quadrants,    no masses,  no organomegaly  Genitalia:    Deferred to GYN  Rectal:    Extremities:   Extremities normal, atraumatic, no cyanosis or edema  Pulses:   2+ and symmetric all extremities  Skin:   Skin color, texture, turgor normal, no rashes or lesions  Lymph nodes:   Cervical, supraclavicular, and axillary nodes normal  Neurologic:   CNII-XII intact, normal strength, sensation and reflexes    throughout          Assessment & Plan:

## 2016-07-17 NOTE — Assessment & Plan Note (Signed)
Pt's PE WNL.  UTD on GYN.  Check labs.  Anticipatory guidance provided.

## 2016-07-17 NOTE — Progress Notes (Signed)
Pre visit review using our clinic review tool, if applicable. No additional management support is needed unless otherwise documented below in the visit note. 

## 2016-07-18 ENCOUNTER — Encounter: Payer: Self-pay | Admitting: General Practice

## 2016-09-04 NOTE — Progress Notes (Signed)
Cardiology Office Note   Date:  09/05/2016   ID:  [name], DOB 10/04/1970, MRN [id number]  PCP:  Midge Minium, MD  Cardiologist:   Peter Martinique, MD   Chief Complaint  Patient presents with  . New Patient (Initial Visit)    No sx  . Establish Care      History of Present Illness: [name] is a 46 y.o. female referred by Dr. Birdie Riddle for evaluation of cardiac evaluation in setting of strong family history of CAD.  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 She is active and exercises at least 3-4 days/wk with running and hot Yoga. She reports a history of SVT over 15 years ago but this resolved and she really doesn't have problems with this now. She has no chest pain, dyspnea, palpitations. She does have a history of Raynaud's phenomena and reports extensive rheumatologic testing has been negative. She is concerned because her mother Brooke Bonito has a history of CAD with prior MI and CHF. Her mother also has longstanding DM. Arnie has no history of HTN, DM, HLD, or tobacco use.    Past Medical History:  Diagnosis Date  . Dysrhythmia    SVT - no recent problems with this  . Raynaud's syndrome   . SVD (spontaneous vaginal delivery) 08/17/2013    Past Surgical History:  Procedure Laterality Date  . DILATION AND CURETTAGE OF UTERUS    . DILATION AND EVACUATION N/A 09/22/2012   Procedure: DILATATION AND EVACUATION;  Surgeon: Logan Bores, MD;  Location: [facility];  Service: Gynecology;  Laterality: N/A;  1 hr OR time  . LUMBAR LAMINECTOMY/DECOMPRESSION MICRODISCECTOMY Left 12/15/2014   Procedure: Left Lumbar 5-Sacral 1 Microdiskectomy;  Surgeon: Erline Levine, MD;  Location: [facility];  Service: Neurosurgery;  Laterality: Left;  Left L5-S1 Microdiskectomy     No current outpatient prescriptions on file.   No current facility-administered medications for this visit.     Allergies:   Patient has no known allergies.    Social  History:  The patient  reports that she has never smoked. She has never used smokeless tobacco. She reports that she does not drink alcohol or use drugs.   Family History:  The patient's family history includes Alcohol abuse in her brother; Diabetes in her maternal grandfather and mother; Healthy in her sister, son, son, and son; Heart attack in her maternal grandfather and mother; Hyperlipidemia in her father, maternal grandfather, and mother; Hypertension in her maternal grandfather, maternal grandmother, mother, and paternal grandmother; Stroke in her maternal grandmother.    ROS:  Please see the history of present illness.   Otherwise, review of systems are positive for none.   All other systems are reviewed and negative.    PHYSICAL EXAM: VS:  BP 103/66   Pulse 65   Ht 5' 8"  (1.727 m)   Wt 139 lb (63 kg)   BMI 21.13 kg/m  , BMI Body mass index is 21.13 kg/m. GEN: Well nourished, well developed, in no acute distress  HEENT: normal  Neck: no JVD, carotid bruits, or masses Cardiac: RRR; no murmurs, rubs, or gallops,no edema  Respiratory:  clear to auscultation bilaterally, normal work of breathing GI: soft, nontender, nondistended, + BS MS: no deformity or atrophy  Skin: warm and dry, no rash Neuro:  Strength and sensation are intact Psych: euthymic mood, full affect   EKG:  EKG is ordered today. The ekg  ordered today demonstrates NSR with incomplete RBBB. Normal Ecg. I have personally reviewed and interpreted this study.    Recent Labs: 07/17/2016: ALT 12; BUN 13; Creatinine, Ser 0.80; Hemoglobin 13.8; Platelets 198.0; Potassium 4.6; Sodium 137; TSH 2.40    Lipid Panel    Component Value Date/Time   CHOL 139 07/17/2016 1513   TRIG 36.0 07/17/2016 1513   HDL 49.30 07/17/2016 1513   CHOLHDL 3 07/17/2016 1513   VLDL 7.2 07/17/2016 1513   LDLCALC 83 07/17/2016 1513      Wt Readings from Last 3 Encounters:  09/05/16 139 lb (63 kg)  07/17/16 139 lb (63 kg)  12/15/14  135 lb (61.2 kg)      Other studies Reviewed: None   ASSESSMENT AND PLAN:  1.  Family history of CAD. This is related primarily to her mother who is diabetic. Tejah has no other risk factors. BP and lipids look good. She follows a heart healthy lifestyle. We discussed the possibility of screening tests like stress testing or coronary calcium score but I don't really feel these are necessary at this time. I also don't recommend she take ASA, fish oil or other supplements besides a good diet. Once she becomes post menopausal she would be fine to take hormonal therapy if needed for symptom control. At this point I have just reassured her and will see her back as needed.   2. Remote history of SVT resolved.  3. Raynaud's phenomena.   Current medicines are reviewed at length with the patient today.  The patient does not have concerns regarding medicines.  The following changes have been made:  no change  Labs/ tests ordered today include: none No orders of the defined types were placed in this encounter.    Disposition:   FU with me prn  Signed, Peter Martinique, MD  09/05/2016 9:04 AM    [facility] [address] [place], [place], [address] Phone [phone], Fax [phone]

## 2016-09-05 ENCOUNTER — Ambulatory Visit (INDEPENDENT_AMBULATORY_CARE_PROVIDER_SITE_OTHER): Payer: 59 | Admitting: Cardiology

## 2016-09-05 ENCOUNTER — Encounter: Payer: Self-pay | Admitting: Cardiology

## 2016-09-05 VITALS — BP 103/66 | HR 65 | Ht 68.0 in | Wt 139.0 lb

## 2016-09-05 DIAGNOSIS — Z8249 Family history of ischemic heart disease and other diseases of the circulatory system: Secondary | ICD-10-CM

## 2016-09-05 DIAGNOSIS — I471 Supraventricular tachycardia: Secondary | ICD-10-CM | POA: Diagnosis not present

## 2016-11-20 DIAGNOSIS — B078 Other viral warts: Secondary | ICD-10-CM | POA: Diagnosis not present

## 2016-11-20 DIAGNOSIS — L821 Other seborrheic keratosis: Secondary | ICD-10-CM | POA: Diagnosis not present

## 2016-11-20 DIAGNOSIS — D2330 Other benign neoplasm of skin of unspecified part of face: Secondary | ICD-10-CM | POA: Diagnosis not present

## 2016-11-20 DIAGNOSIS — D485 Neoplasm of uncertain behavior of skin: Secondary | ICD-10-CM | POA: Diagnosis not present

## 2016-11-20 DIAGNOSIS — D2271 Melanocytic nevi of right lower limb, including hip: Secondary | ICD-10-CM | POA: Diagnosis not present

## 2016-11-20 DIAGNOSIS — Z86018 Personal history of other benign neoplasm: Secondary | ICD-10-CM | POA: Diagnosis not present

## 2017-04-21 IMAGING — MR MR LUMBAR SPINE W/O CM
4 of 5 series · 19 of 48 positions shown · non-contrast
Comparison: None.

CLINICAL DATA: Acute onset of left leg numbness and weakness.

EXAM:
MRI LUMBAR SPINE WITHOUT CONTRAST
TECHNIQUE: Multiplanar, multisequence MR imaging of the lumbar spine was
performed. No intravenous contrast was administered.

[Series 3: T2 · sagittal · 4.0mm · 0.55mm/px · 5 of 13 slices shown (1 of 2)]
[im 1/13]
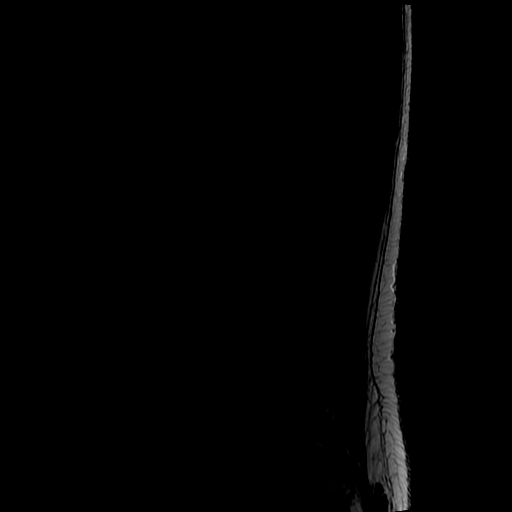
[im 4/13]
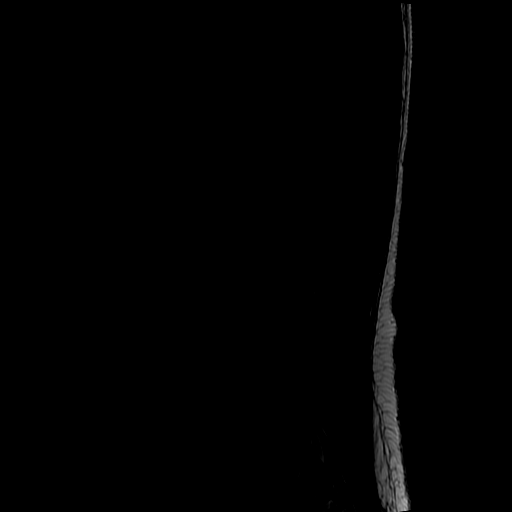
[im 7/13]
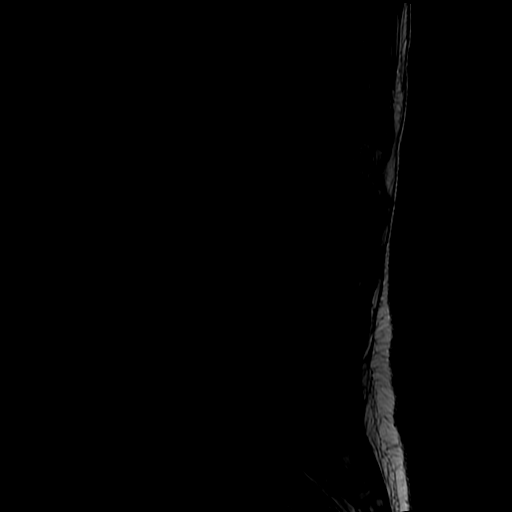
[im 10/13]
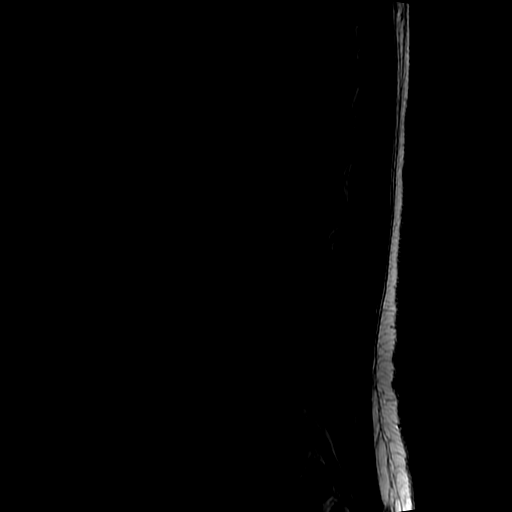
[im 13/13]
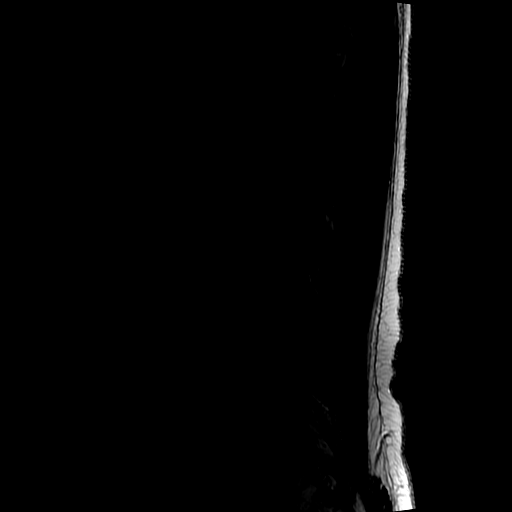

[Series 5: T1 · sagittal · 4.0mm · 0.55mm/px · 3 of 13 slices shown (1 of 2)]
[im 3/13]
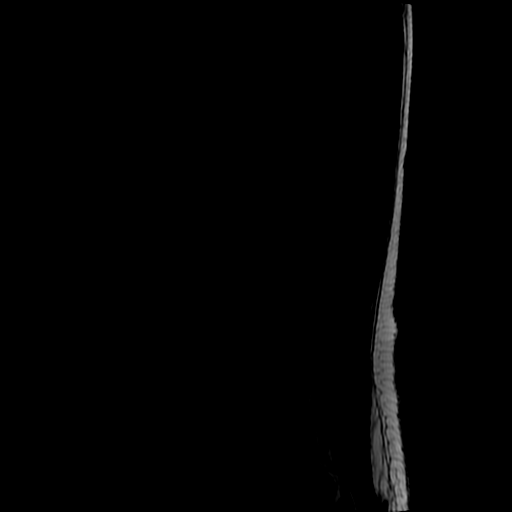
[im 8/13]
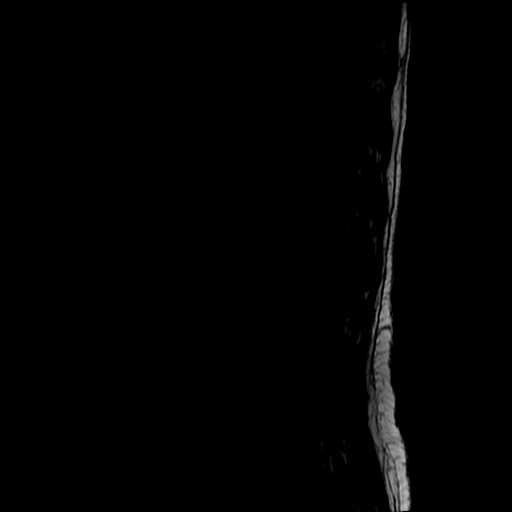
[im 13/13]
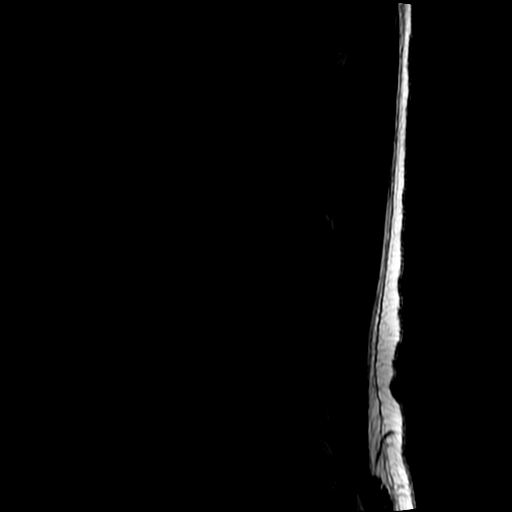

[Series 6: T2 · axial · 4.0mm · 0.39mm/px · z∈[-151,+7]mm · 8 of 36 slices shown (2 of 2)]
[im 3/36]
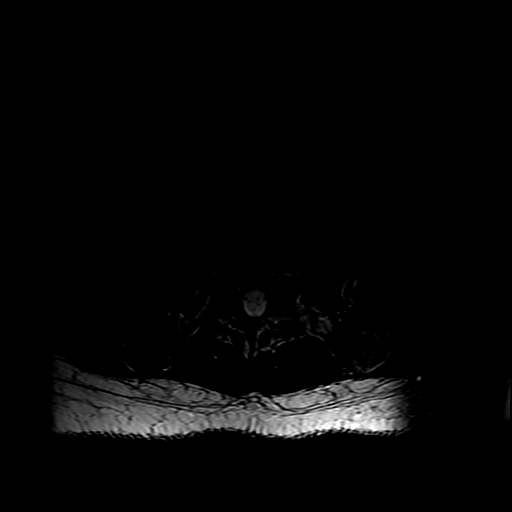
[im 5/36]
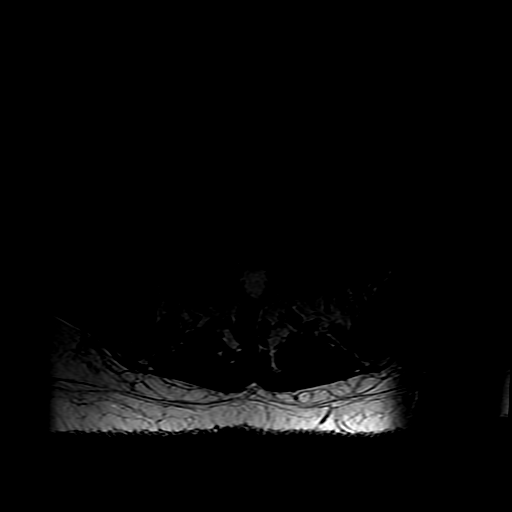
[im 8/36]
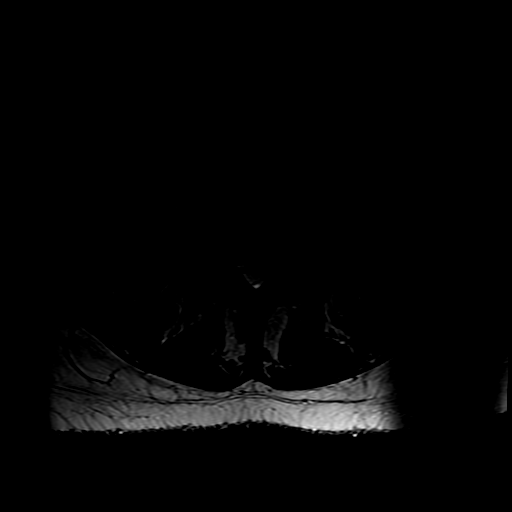
[im 12/36]
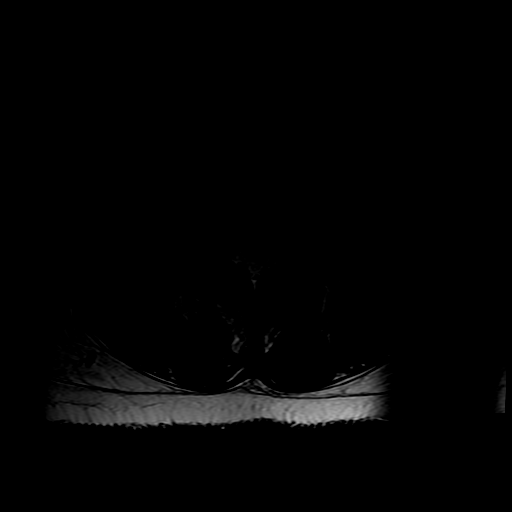
[im 17/36]
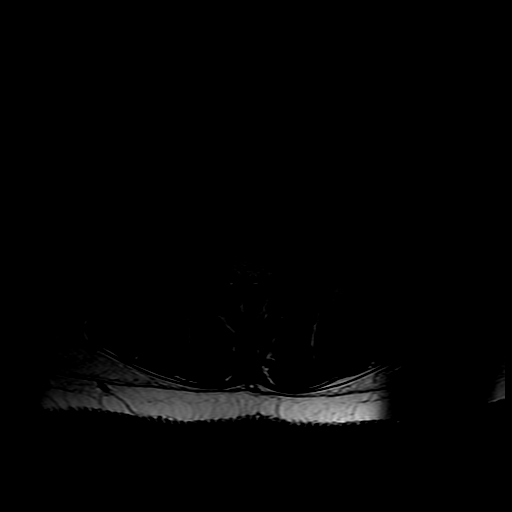
[im 19/36]
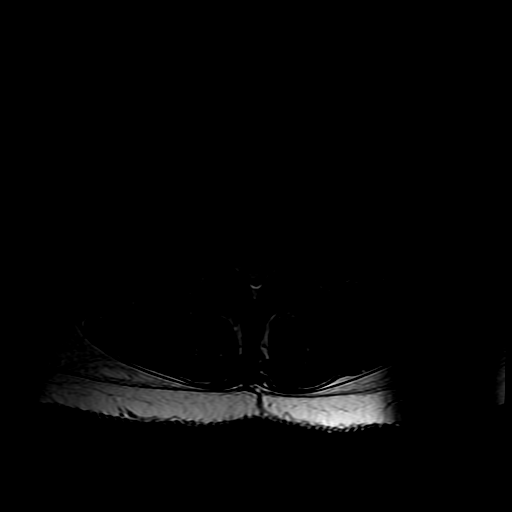
[im 22/36]
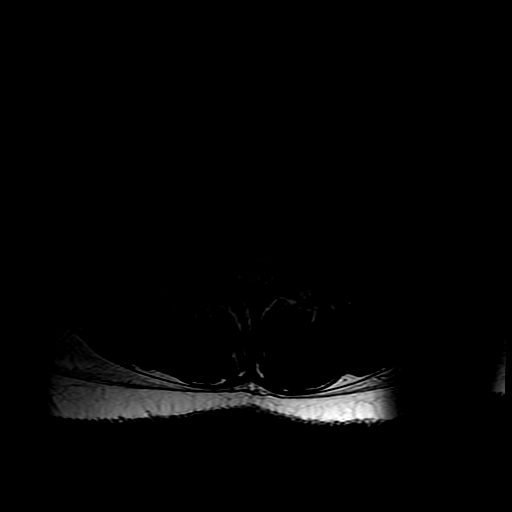
[im 31/36]
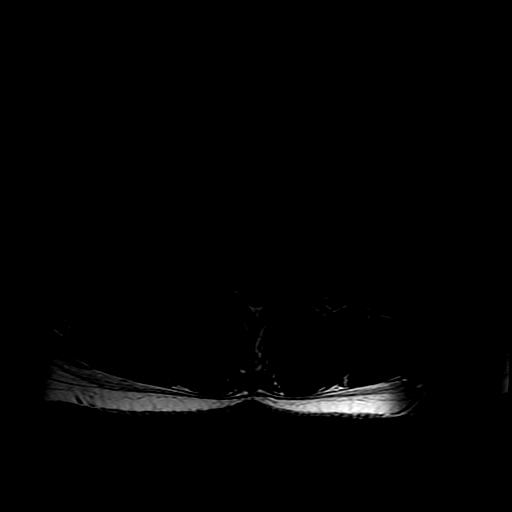

[Series 7: T1 · axial · 4.0mm · 0.39mm/px · z∈[-141,+7]mm · 3 of 36 slices shown (2 of 2)]
[im 5/36]
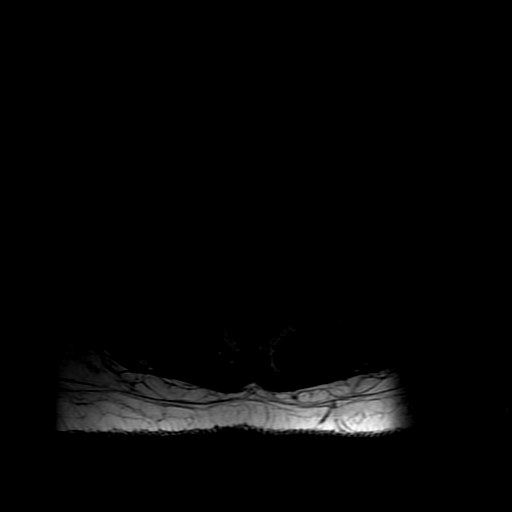
[im 19/36]
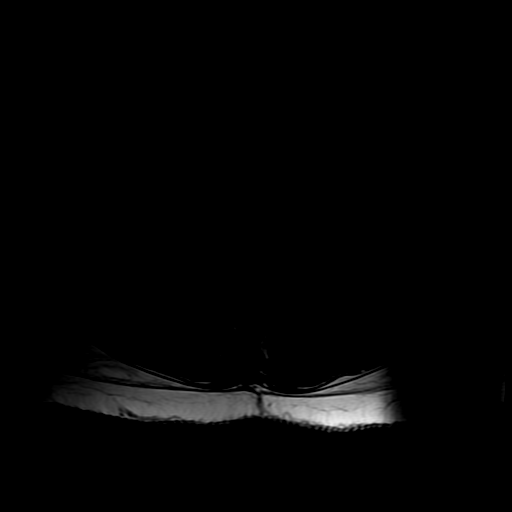
[im 31/36]
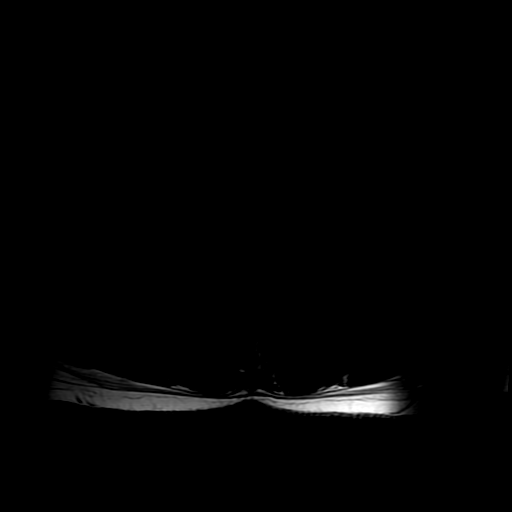

[19 of 48 positions shown; findings below may reference images not displayed]

FINDINGS: Normal conus tip at L1.  Normal paraspinal soft tissues.

T10-11 through L4-5:  Normal.

L5-S1: Prominent soft disc protrusion central and to the left
compressing the left S1 nerve and the left side of the thecal sac.
The L5 nerves exit without impingement. The disc protrusion extends
across the midline to the right. No facet arthritis.
IMPRESSION: Focal soft disc protrusion at L5-S1 central and to the left
compressing the left S1 nerve.

## 2017-07-15 DIAGNOSIS — Z1389 Encounter for screening for other disorder: Secondary | ICD-10-CM | POA: Diagnosis not present

## 2017-07-15 DIAGNOSIS — Z124 Encounter for screening for malignant neoplasm of cervix: Secondary | ICD-10-CM | POA: Diagnosis not present

## 2017-07-15 DIAGNOSIS — Z1231 Encounter for screening mammogram for malignant neoplasm of breast: Secondary | ICD-10-CM | POA: Diagnosis not present

## 2017-07-15 DIAGNOSIS — Z130 Encounter for screening for diseases of the blood and blood-forming organs and certain disorders involving the immune mechanism: Secondary | ICD-10-CM | POA: Diagnosis not present

## 2017-07-15 DIAGNOSIS — Z975 Presence of (intrauterine) contraceptive device: Secondary | ICD-10-CM | POA: Diagnosis not present

## 2017-07-15 DIAGNOSIS — Z6820 Body mass index (BMI) 20.0-20.9, adult: Secondary | ICD-10-CM | POA: Diagnosis not present

## 2017-07-15 DIAGNOSIS — Z01419 Encounter for gynecological examination (general) (routine) without abnormal findings: Secondary | ICD-10-CM | POA: Diagnosis not present

## 2017-07-15 LAB — HM PAP SMEAR

## 2017-07-15 LAB — HM MAMMOGRAPHY: HM Mammogram: NORMAL (ref 0–4)

## 2017-07-16 DIAGNOSIS — Z124 Encounter for screening for malignant neoplasm of cervix: Secondary | ICD-10-CM | POA: Diagnosis not present

## 2017-07-22 ENCOUNTER — Encounter: Payer: Self-pay | Admitting: General Practice

## 2017-09-09 ENCOUNTER — Ambulatory Visit (INDEPENDENT_AMBULATORY_CARE_PROVIDER_SITE_OTHER): Payer: 59 | Admitting: Family Medicine

## 2017-09-09 ENCOUNTER — Other Ambulatory Visit: Payer: Self-pay

## 2017-09-09 ENCOUNTER — Encounter: Payer: Self-pay | Admitting: Family Medicine

## 2017-09-09 VITALS — BP 100/67 | HR 50 | Temp 98.3°F | Resp 16 | Ht 68.0 in | Wt 138.4 lb

## 2017-09-09 DIAGNOSIS — Z0000 Encounter for general adult medical examination without abnormal findings: Secondary | ICD-10-CM | POA: Diagnosis not present

## 2017-09-09 NOTE — Patient Instructions (Signed)
Follow up in 1 year or as needed No need for labs today- they were excellent last year!  So we'll repeat every 2-3 years or as needed Keep up the good work on healthy diet and regular exercise- you look great! Call with any questions or concerns Have a great summer!!

## 2017-09-09 NOTE — Progress Notes (Signed)
   Subjective:    Patient ID: [name], female    DOB: 1970-08-10, 47 y.o.   MRN: [id number]  HPI CPE- UTD on GYN, Tdap.  No concerns today   Review of Systems Patient reports no vision/ hearing changes, adenopathy,fever, weight change,  persistant/recurrent hoarseness , swallowing issues, chest pain, palpitations, edema, persistant/recurrent cough, hemoptysis, dyspnea (rest/exertional/paroxysmal nocturnal), gastrointestinal bleeding (melena, rectal bleeding), abdominal pain, significant heartburn, bowel changes, GU symptoms (dysuria, hematuria, incontinence), Gyn symptoms (abnormal  bleeding, pain),  syncope, focal weakness, memory loss, numbness & tingling, skin/hair/nail changes, abnormal bruising or bleeding, anxiety, or depression.     Objective:   Physical Exam General Appearance:    Alert, cooperative, no distress, appears stated age  Head:    Normocephalic, without obvious abnormality, atraumatic  Eyes:    PERRL, conjunctiva/corneas clear, EOM's intact, fundi    benign, both eyes  Ears:    Normal TM's and external ear canals, both ears  Nose:   Nares normal, septum midline, mucosa normal, no drainage    or sinus tenderness  Throat:   Lips, mucosa, and tongue normal; teeth and gums normal  Neck:   Supple, symmetrical, trachea midline, no adenopathy;    Thyroid: no enlargement/tenderness/nodules  Back:     Symmetric, no curvature, ROM normal, no CVA tenderness  Lungs:     Clear to auscultation bilaterally, respirations unlabored  Chest Wall:    No tenderness or deformity   Heart:    Regular rate and rhythm, S1 and S2 normal, no murmur, rub   or gallop  Breast Exam:    Deferred to GYN  Abdomen:     Soft, non-tender, bowel sounds active all four quadrants,    no masses, no organomegaly  Genitalia:    Deferred to GYN  Rectal:    Extremities:   Extremities normal, atraumatic, no cyanosis or edema  Pulses:   2+ and symmetric all extremities  Skin:   Skin color, texture,  turgor normal, no rashes or lesions  Lymph nodes:   Cervical, supraclavicular, and axillary nodes normal  Neurologic:   CNII-XII intact, normal strength, sensation and reflexes    throughout          Assessment & Plan:

## 2017-09-09 NOTE — Assessment & Plan Note (Signed)
Pt's PE WNL.  Reviewed labs from last year- no need to repeat.  Anticipatory guidance provided.

## 2017-12-17 DIAGNOSIS — D225 Melanocytic nevi of trunk: Secondary | ICD-10-CM | POA: Diagnosis not present

## 2017-12-17 DIAGNOSIS — L821 Other seborrheic keratosis: Secondary | ICD-10-CM | POA: Diagnosis not present

## 2017-12-17 DIAGNOSIS — L720 Epidermal cyst: Secondary | ICD-10-CM | POA: Diagnosis not present

## 2017-12-17 DIAGNOSIS — D2271 Melanocytic nevi of right lower limb, including hip: Secondary | ICD-10-CM | POA: Diagnosis not present

## 2017-12-17 DIAGNOSIS — D2330 Other benign neoplasm of skin of unspecified part of face: Secondary | ICD-10-CM | POA: Diagnosis not present

## 2017-12-17 DIAGNOSIS — L814 Other melanin hyperpigmentation: Secondary | ICD-10-CM | POA: Diagnosis not present

## 2017-12-17 DIAGNOSIS — Z86018 Personal history of other benign neoplasm: Secondary | ICD-10-CM | POA: Diagnosis not present

## 2017-12-17 DIAGNOSIS — D485 Neoplasm of uncertain behavior of skin: Secondary | ICD-10-CM | POA: Diagnosis not present

## 2018-06-23 DIAGNOSIS — H5201 Hypermetropia, right eye: Secondary | ICD-10-CM | POA: Diagnosis not present

## 2018-06-23 DIAGNOSIS — Q149 Congenital malformation of posterior segment of eye, unspecified: Secondary | ICD-10-CM | POA: Diagnosis not present

## 2018-06-23 DIAGNOSIS — H524 Presbyopia: Secondary | ICD-10-CM | POA: Diagnosis not present

## 2018-06-23 DIAGNOSIS — H1789 Other corneal scars and opacities: Secondary | ICD-10-CM | POA: Diagnosis not present

## 2018-09-01 DIAGNOSIS — L710 Perioral dermatitis: Secondary | ICD-10-CM | POA: Diagnosis not present

## 2018-09-01 MED FILL — DOXYCYCLINE HYC 100 MG CAPS: 100 | 14 days supply | Qty: 28 | Fill #0

## 2018-09-01 MED FILL — metroNIDAZOLE 0.75 % GEL: 0.75 | 30 days supply | Qty: 70 | Fill #0

## 2018-09-11 ENCOUNTER — Other Ambulatory Visit: Payer: Self-pay

## 2018-09-11 ENCOUNTER — Encounter: Payer: Self-pay | Admitting: Family Medicine

## 2018-09-11 ENCOUNTER — Ambulatory Visit (INDEPENDENT_AMBULATORY_CARE_PROVIDER_SITE_OTHER): Payer: 59 | Admitting: Family Medicine

## 2018-09-11 VITALS — BP 110/70 | HR 51 | Temp 96.7°F | Ht 69.0 in | Wt 134.6 lb

## 2018-09-11 DIAGNOSIS — Z0000 Encounter for general adult medical examination without abnormal findings: Secondary | ICD-10-CM | POA: Diagnosis not present

## 2018-09-11 NOTE — Patient Instructions (Addendum)
Follow up in 1 year or as needed Keep up the good work on healthy diet and regular exercise- you look great! Call with any questions or concerns Stay Safe!!!

## 2018-09-11 NOTE — Assessment & Plan Note (Signed)
Pt's PE WNL.  UTD on GYN, immunizations.  Check labs.  Anticipatory guidance provided.

## 2018-09-11 NOTE — Progress Notes (Signed)
   Subjective:    Patient ID: [name], female    DOB: 17-Feb-1971, 48 y.o.   MRN: [id number]  HPI CPE- UTD on GYN, immunizations.   Review of Systems Patient reports no vision/ hearing changes, adenopathy,fever, weight change,  persistant/recurrent hoarseness , swallowing issues, chest pain, palpitations, edema, persistant/recurrent cough, hemoptysis, dyspnea (rest/exertional/paroxysmal nocturnal), gastrointestinal bleeding (melena, rectal bleeding), abdominal pain, significant heartburn, bowel changes, GU symptoms (dysuria, hematuria, incontinence), Gyn symptoms (abnormal  bleeding, pain),  syncope, focal weakness, memory loss, numbness & tingling, skin/hair/nail changes, abnormal bruising or bleeding, anxiety, or depression.     Objective:   Physical Exam General Appearance:    Alert, cooperative, no distress, appears stated age  Head:    Normocephalic, without obvious abnormality, atraumatic  Eyes:    PERRL, conjunctiva/corneas clear, EOM's intact, fundi    benign, both eyes  Ears:    Normal TM's and external ear canals, both ears  Nose:   Nares normal, septum midline, mucosa normal, no drainage    or sinus tenderness  Throat:   Lips, mucosa, and tongue normal; teeth and gums normal  Neck:   Supple, symmetrical, trachea midline, no adenopathy;    Thyroid: no enlargement/tenderness/nodules  Back:     Symmetric, no curvature, ROM normal, no CVA tenderness  Lungs:     Clear to auscultation bilaterally, respirations unlabored  Chest Wall:    No tenderness or deformity   Heart:    Regular rate and rhythm, S1 and S2 normal, no murmur, rub   or gallop  Breast Exam:    Deferred to GYN  Abdomen:     Soft, non-tender, bowel sounds active all four quadrants,    no masses, no organomegaly  Genitalia:    Deferred to GYN  Rectal:    Extremities:   Extremities normal, atraumatic, no cyanosis or edema  Pulses:   2+ and symmetric all extremities  Skin:   Skin color, texture, turgor  normal, no rashes or lesions  Lymph nodes:   Cervical, supraclavicular, and axillary nodes normal  Neurologic:   CNII-XII intact, normal strength, sensation and reflexes    throughout          Assessment & Plan:

## 2019-01-01 DIAGNOSIS — L719 Rosacea, unspecified: Secondary | ICD-10-CM | POA: Diagnosis not present

## 2019-01-01 DIAGNOSIS — Z23 Encounter for immunization: Secondary | ICD-10-CM | POA: Diagnosis not present

## 2019-01-01 DIAGNOSIS — D485 Neoplasm of uncertain behavior of skin: Secondary | ICD-10-CM | POA: Diagnosis not present

## 2019-01-01 DIAGNOSIS — L814 Other melanin hyperpigmentation: Secondary | ICD-10-CM | POA: Diagnosis not present

## 2019-01-01 DIAGNOSIS — Z86018 Personal history of other benign neoplasm: Secondary | ICD-10-CM | POA: Diagnosis not present

## 2019-01-01 DIAGNOSIS — D225 Melanocytic nevi of trunk: Secondary | ICD-10-CM | POA: Diagnosis not present

## 2019-01-01 DIAGNOSIS — D2239 Melanocytic nevi of other parts of face: Secondary | ICD-10-CM | POA: Diagnosis not present

## 2019-01-01 DIAGNOSIS — L821 Other seborrheic keratosis: Secondary | ICD-10-CM | POA: Diagnosis not present

## 2019-01-01 DIAGNOSIS — D2271 Melanocytic nevi of right lower limb, including hip: Secondary | ICD-10-CM | POA: Diagnosis not present

## 2019-01-01 DIAGNOSIS — D2330 Other benign neoplasm of skin of unspecified part of face: Secondary | ICD-10-CM | POA: Diagnosis not present

## 2019-09-01 DIAGNOSIS — Z1231 Encounter for screening mammogram for malignant neoplasm of breast: Secondary | ICD-10-CM | POA: Diagnosis not present

## 2019-09-01 DIAGNOSIS — Z130 Encounter for screening for diseases of the blood and blood-forming organs and certain disorders involving the immune mechanism: Secondary | ICD-10-CM | POA: Diagnosis not present

## 2019-09-01 DIAGNOSIS — Z01419 Encounter for gynecological examination (general) (routine) without abnormal findings: Secondary | ICD-10-CM | POA: Diagnosis not present

## 2019-09-01 DIAGNOSIS — Z975 Presence of (intrauterine) contraceptive device: Secondary | ICD-10-CM | POA: Diagnosis not present

## 2019-09-01 DIAGNOSIS — Z1389 Encounter for screening for other disorder: Secondary | ICD-10-CM | POA: Diagnosis not present

## 2019-09-01 DIAGNOSIS — Z6821 Body mass index (BMI) 21.0-21.9, adult: Secondary | ICD-10-CM | POA: Diagnosis not present

## 2019-09-01 LAB — HM PAP SMEAR

## 2019-09-01 LAB — HM MAMMOGRAPHY

## 2019-09-13 ENCOUNTER — Encounter: Payer: Self-pay | Admitting: General Practice

## 2019-09-13 ENCOUNTER — Other Ambulatory Visit: Payer: Self-pay

## 2019-09-13 ENCOUNTER — Ambulatory Visit (INDEPENDENT_AMBULATORY_CARE_PROVIDER_SITE_OTHER): Payer: 59 | Admitting: Family Medicine

## 2019-09-13 ENCOUNTER — Encounter: Payer: Self-pay | Admitting: Family Medicine

## 2019-09-13 VITALS — BP 116/80 | HR 65 | Temp 97.9°F | Resp 16 | Ht 69.0 in | Wt 134.5 lb

## 2019-09-13 DIAGNOSIS — Z8249 Family history of ischemic heart disease and other diseases of the circulatory system: Secondary | ICD-10-CM | POA: Diagnosis not present

## 2019-09-13 DIAGNOSIS — Z0000 Encounter for general adult medical examination without abnormal findings: Secondary | ICD-10-CM

## 2019-09-13 LAB — LIPID PANEL
Cholesterol: 144 mg/dL (ref 0–200)
HDL: 49.8 mg/dL (ref >39.00)
LDL Cholesterol: 84 mg/dL (ref 0–99)
NonHDL: 94.46
Total CHOL/HDL Ratio: 3
Triglycerides: 52 mg/dL (ref 0.0–149.0)
VLDL: 10.4 mg/dL (ref 0.0–40.0)

## 2019-09-13 LAB — CBC WITH DIFFERENTIAL/PLATELET
Basophils Absolute: 0.1 10*3/uL (ref 0.0–0.1)
Basophils Relative: 1.4 % (ref 0.0–3.0)
Eosinophils Absolute: 0 10*3/uL (ref 0.0–0.7)
Eosinophils Relative: 0.8 % (ref 0.0–5.0)
HCT: 42.1 % (ref 36.0–46.0)
Hemoglobin: 14.2 g/dL (ref 12.0–15.0)
Lymphocytes Relative: 36.1 % (ref 12.0–46.0)
Lymphs Abs: 1.8 10*3/uL (ref 0.7–4.0)
MCHC: 33.8 g/dL (ref 30.0–36.0)
MCV: 103.2 fl — ABNORMAL HIGH (ref 78.0–100.0)
Monocytes Absolute: 0.4 10*3/uL (ref 0.1–1.0)
Monocytes Relative: 8.1 % (ref 3.0–12.0)
Neutro Abs: 2.6 10*3/uL (ref 1.4–7.7)
Neutrophils Relative %: 53.6 % (ref 43.0–77.0)
Platelets: 189 10*3/uL (ref 150.0–400.0)
RBC: 4.08 Mil/uL (ref 3.87–5.11)
RDW: 13.1 % (ref 11.5–15.5)
WBC: 4.9 10*3/uL (ref 4.0–10.5)

## 2019-09-13 LAB — BASIC METABOLIC PANEL
BUN: 20 mg/dL (ref 6–23)
CO2: 27 mEq/L (ref 19–32)
Calcium: 9.4 mg/dL (ref 8.4–10.5)
Chloride: 103 mEq/L (ref 96–112)
Creatinine, Ser: 0.84 mg/dL (ref 0.40–1.20)
GFR: 72.53 mL/min (ref >60.00)
Glucose, Bld: 89 mg/dL (ref 70–99)
Potassium: 4.7 mEq/L (ref 3.5–5.1)
Sodium: 137 mEq/L (ref 135–145)

## 2019-09-13 LAB — HEPATIC FUNCTION PANEL
ALT: 13 U/L (ref 0–35)
AST: 16 U/L (ref 0–37)
Albumin: 4.6 g/dL (ref 3.5–5.2)
Alkaline Phosphatase: 55 U/L (ref 39–117)
Bilirubin, Direct: 0.1 mg/dL (ref 0.0–0.3)
Total Bilirubin: 0.7 mg/dL (ref 0.2–1.2)
Total Protein: 6.3 g/dL (ref 6.0–8.3)

## 2019-09-13 LAB — TSH: TSH: 2.7 u[IU]/mL (ref 0.35–4.50)

## 2019-09-13 NOTE — Assessment & Plan Note (Signed)
Check labs to risk stratify

## 2019-09-13 NOTE — Progress Notes (Signed)
   Subjective:    Patient ID: [name], female    DOB: 1970-06-16, 49 y.o.   MRN: [id number]  HPI CPE- UTD on GYN, Tdap.  Has had both COVID vaccines   Review of Systems Patient reports no vision/ hearing changes, adenopathy,fever, weight change,  persistant/recurrent hoarseness , swallowing issues, chest pain, palpitations, edema, persistant/recurrent cough, hemoptysis, dyspnea (rest/exertional/paroxysmal nocturnal), gastrointestinal bleeding (melena, rectal bleeding), abdominal pain, significant heartburn, bowel changes, GU symptoms (dysuria, hematuria, incontinence), Gyn symptoms (abnormal  bleeding, pain),  syncope, focal weakness, memory loss, numbness & tingling, skin/hair/nail changes, abnormal bruising or bleeding, anxiety, or depression.   This visit occurred during the SARS-CoV-2 public health emergency.  Safety protocols were in place, including screening questions prior to the visit, additional usage of staff PPE, and extensive cleaning of exam room while observing appropriate contact time as indicated for disinfecting solutions.       Objective:   Physical Exam General Appearance:    Alert, cooperative, no distress, appears stated age  Head:    Normocephalic, without obvious abnormality, atraumatic  Eyes:    PERRL, conjunctiva/corneas clear, EOM's intact, fundi    benign, both eyes  Ears:    Normal TM's and external ear canals, both ears  Nose:   Nares normal, septum midline, mucosa normal, no drainage    or sinus tenderness  Throat:   Lips, mucosa, and tongue normal; teeth and gums normal  Neck:   Supple, symmetrical, trachea midline, no adenopathy;    Thyroid: no enlargement/tenderness/nodules  Back:     Symmetric, no curvature, ROM normal, no CVA tenderness  Lungs:     Clear to auscultation bilaterally, respirations unlabored  Chest Wall:    No tenderness or deformity   Heart:    Regular rate and rhythm, S1 and S2 normal, no murmur, rub   or gallop  Breast Exam:     Deferred to GYN  Abdomen:     Soft, non-tender, bowel sounds active all four quadrants,    no masses, no organomegaly  Genitalia:    Deferred to GYN  Rectal:    Extremities:   Extremities normal, atraumatic, no cyanosis or edema  Pulses:   2+ and symmetric all extremities  Skin:   Skin color, texture, turgor normal, no rashes or lesions  Lymph nodes:   Cervical, supraclavicular, and axillary nodes normal  Neurologic:   CNII-XII intact, normal strength, sensation and reflexes    throughout          Assessment & Plan:

## 2019-09-13 NOTE — Assessment & Plan Note (Signed)
Pt's PE WNL.  UTD on immunizations and GYN.  Check labs.  Anticipatory guidance provided.

## 2019-09-13 NOTE — Patient Instructions (Signed)
Follow up in 1 year or as needed We'll notify you of your lab results and make any changes if needed Keep up the good work on healthy diet and regular exercise- you look great! Call with any questions or concerns Have a great summer!!!

## 2019-10-05 ENCOUNTER — Encounter: Payer: Self-pay | Admitting: General Practice

## 2020-01-18 DIAGNOSIS — D2330 Other benign neoplasm of skin of unspecified part of face: Secondary | ICD-10-CM | POA: Diagnosis not present

## 2020-01-18 DIAGNOSIS — D225 Melanocytic nevi of trunk: Secondary | ICD-10-CM | POA: Diagnosis not present

## 2020-01-18 DIAGNOSIS — D2271 Melanocytic nevi of right lower limb, including hip: Secondary | ICD-10-CM | POA: Diagnosis not present

## 2020-01-18 DIAGNOSIS — Z86018 Personal history of other benign neoplasm: Secondary | ICD-10-CM | POA: Diagnosis not present

## 2020-01-18 DIAGNOSIS — L719 Rosacea, unspecified: Secondary | ICD-10-CM | POA: Diagnosis not present

## 2020-01-18 DIAGNOSIS — L821 Other seborrheic keratosis: Secondary | ICD-10-CM | POA: Diagnosis not present

## 2020-01-18 DIAGNOSIS — L710 Perioral dermatitis: Secondary | ICD-10-CM | POA: Diagnosis not present

## 2020-01-18 DIAGNOSIS — L814 Other melanin hyperpigmentation: Secondary | ICD-10-CM | POA: Diagnosis not present

## 2020-09-18 ENCOUNTER — Encounter: Payer: 59 | Admitting: Family Medicine

## 2020-09-27 DIAGNOSIS — Z01419 Encounter for gynecological examination (general) (routine) without abnormal findings: Secondary | ICD-10-CM | POA: Diagnosis not present

## 2020-09-27 DIAGNOSIS — Z30432 Encounter for removal of intrauterine contraceptive device: Secondary | ICD-10-CM | POA: Diagnosis not present

## 2020-09-27 DIAGNOSIS — Z1231 Encounter for screening mammogram for malignant neoplasm of breast: Secondary | ICD-10-CM | POA: Diagnosis not present

## 2020-09-27 DIAGNOSIS — Z1389 Encounter for screening for other disorder: Secondary | ICD-10-CM | POA: Diagnosis not present

## 2020-09-27 DIAGNOSIS — Z30430 Encounter for insertion of intrauterine contraceptive device: Secondary | ICD-10-CM | POA: Diagnosis not present

## 2020-09-27 DIAGNOSIS — Z6821 Body mass index (BMI) 21.0-21.9, adult: Secondary | ICD-10-CM | POA: Diagnosis not present

## 2020-09-27 DIAGNOSIS — Z130 Encounter for screening for diseases of the blood and blood-forming organs and certain disorders involving the immune mechanism: Secondary | ICD-10-CM | POA: Diagnosis not present

## 2020-10-04 ENCOUNTER — Encounter: Payer: Self-pay | Admitting: *Deleted

## 2020-11-29 DIAGNOSIS — Z30431 Encounter for routine checking of intrauterine contraceptive device: Secondary | ICD-10-CM | POA: Diagnosis not present

## 2020-12-14 ENCOUNTER — Other Ambulatory Visit: Payer: Self-pay

## 2020-12-14 DIAGNOSIS — I781 Nevus, non-neoplastic: Secondary | ICD-10-CM

## 2020-12-15 ENCOUNTER — Encounter: Payer: 59 | Admitting: Family Medicine

## 2020-12-18 ENCOUNTER — Encounter: Payer: Self-pay | Admitting: Physician Assistant

## 2020-12-18 ENCOUNTER — Ambulatory Visit (HOSPITAL_COMMUNITY)
Admission: RE | Admit: 2020-12-18 | Discharge: 2020-12-18 | Disposition: A | Discharge status: Home / Self Care | Payer: 59 | Source: Ambulatory Visit | Attending: Surgery | Admitting: Surgery

## 2020-12-18 ENCOUNTER — Ambulatory Visit (INDEPENDENT_AMBULATORY_CARE_PROVIDER_SITE_OTHER): Payer: 59 | Admitting: Physician Assistant

## 2020-12-18 ENCOUNTER — Other Ambulatory Visit: Payer: Self-pay

## 2020-12-18 VITALS — BP 100/66 | HR 61 | Temp 98.0°F | Resp 14 | Ht 69.0 in | Wt 135.0 lb

## 2020-12-18 DIAGNOSIS — I872 Venous insufficiency (chronic) (peripheral): Secondary | ICD-10-CM | POA: Diagnosis not present

## 2020-12-18 DIAGNOSIS — I781 Nevus, non-neoplastic: Secondary | ICD-10-CM

## 2020-12-18 NOTE — Progress Notes (Signed)
[facility]   Reason for referral: B ankle/foot spider veins with varicosities   History of Present Illness  [name] is a 50 y.o. female who presents with chief complaint: swollen leg.  Patient notes, onset of enlarged veins with spider veins was first noted when she was pregnant with her first child and with each pregnancy after that she has increased visible veins.  She denise much edema, but her legs dop feel heavy at the end of her work day.  The patient has had no history of DVT, no history of varicose vein, no history of venous stasis ulcers, no history of  Lymphedema and + history of skin changes in lower legs.  There is a family history of venous disorders.  The patient has used compression stockings in the past.  Past Medical History:  Diagnosis Date   Dysrhythmia    SVT - no recent problems with this   Raynaud's syndrome    SVD (spontaneous vaginal delivery) 08/17/2013    Past Surgical History:  Procedure Laterality Date   DILATION AND CURETTAGE OF UTERUS     DILATION AND EVACUATION N/A 09/22/2012   Procedure: DILATATION AND EVACUATION;  Surgeon: Logan Bores, MD;  Location: [facility];  Service: Gynecology;  Laterality: N/A;  1 hr OR time   LUMBAR LAMINECTOMY/DECOMPRESSION MICRODISCECTOMY Left 12/15/2014   Procedure: Left Lumbar 5-Sacral 1 Microdiskectomy;  Surgeon: Erline Levine, MD;  Location: [facility];  Service: Neurosurgery;  Laterality: Left;  Left L5-S1 Microdiskectomy    Social History   Socioeconomic History   Marital status: Married    Spouse name: Not on file   Number of children: 4   Years of education: Not on file   Highest education level: Not on file  Occupational History   Occupation: CRNA  Tobacco Use   Smoking status: Never   Smokeless tobacco: Never  Substance and Sexual Activity   Alcohol use: No   Drug use: No   Sexual activity: Yes  Other Topics Concern   Not on file  Social History Narrative   Not  on file   Social Determinants of Health   Financial Resource Strain: Not on file  Food Insecurity: Not on file  Transportation Needs: Not on file  Physical Activity: Not on file  Stress: Not on file  Social Connections: Not on file  Intimate Partner Violence: Not on file    Family History  Problem Relation Age of Onset   Hyperlipidemia Mother    Hypertension Mother    Diabetes Mother    Heart attack Mother    Hyperlipidemia Father    Healthy Sister    Alcohol abuse Brother    Healthy Son    Hypertension Maternal Grandmother    Stroke Maternal Grandmother    Hypertension Maternal Grandfather    Hyperlipidemia Maternal Grandfather    Diabetes Maternal Grandfather    Heart attack Maternal Grandfather    Hypertension Paternal Grandmother    Healthy Son    Healthy Son     Current Outpatient Medications on File Prior to Visit  Medication Sig Dispense Refill   Ascorbic Acid (VITAMIN C PO) Take by mouth.     levonorgestrel (MIRENA) 20 MCG/24HR IUD Mirena 20 mcg/24 hours (5 yrs) 52 mg intrauterine device  Take by intrauterine route.     Omega-3 Fatty Acids (FISH OIL PO) Fish Oil     No current facility-administered medications on file prior to visit.    Allergies  as of 12/18/2020   (No Known Allergies)     ROS:   General:  No weight loss, Fever, chills  HEENT: No recent headaches, no nasal bleeding, no visual changes, no sore throat  Neurologic: No dizziness, blackouts, seizures. No recent symptoms of stroke or mini- stroke. No recent episodes of slurred speech, or temporary blindness.  Cardiac: No recent episodes of chest pain/pressure, no shortness of breath at rest.  No shortness of breath with exertion.  Denies history of atrial fibrillation or irregular heartbeat  Vascular: No history of rest pain in feet.  No history of claudication.  No history of non-healing ulcer, No history of DVT   Pulmonary: No home oxygen, no productive cough, no hemoptysis,  No asthma  or wheezing  Musculoskeletal:  [ ]  Arthritis, [ ]  Low back pain,  [ ]  Joint pain  Hematologic:No history of hypercoagulable state.  No history of easy bleeding.  No history of anemia  Gastrointestinal: No hematochezia or melena,  No gastroesophageal reflux, no trouble swallowing  Urinary: [ ]  chronic Kidney disease, [ ]  on HD - [ ]  MWF or [ ]  TTHS, [ ]  Burning with urination, [ ]  Frequent urination, [ ]  Difficulty urinating;   Skin: No rashes  Psychological: No history of anxiety,  No history of depression  Physical Examination  Vitals:   12/18/20 0831  BP: 100/66  Pulse: 61  Resp: 14  Temp: 98 F (36.7 C)  TempSrc: Temporal  Weight: 135 lb (61.2 kg)  Height: 5' 9"  (1.753 m)    Body mass index is 19.94 kg/m.  General:  Alert and oriented, no acute distress HEENT: Normal Neck: No bruit or JVD Pulmonary: Clear to auscultation bilaterally Cardiac: Regular Rate and Rhythm without murmur Abdomen: Soft, non-tender, non-distended, no mass, no scars Skin: No rash       Extremity Pulses:  2+ radial, brachial, femoral, dorsalis pedis, posterior tibial pulses bilaterally Musculoskeletal: No deformity or edema  Neurologic: Upper and lower extremity motor 5/5 and symmetric  DATA:    Venous Reflux Times  +--------------+---------+------+-----------+------------+--------+  RIGHT         Reflux NoRefluxReflux TimeDiameter cmsComments                          Yes                                   +--------------+---------+------+-----------+------------+--------+  CFV                     yes                                   +--------------+---------+------+-----------+------------+--------+  FV mid        no                                              +--------------+---------+------+-----------+------------+--------+  Popliteal     no                                               +--------------+---------+------+-----------+------------+--------+  GSV at [facility]    no                            0.32              +--------------+---------+------+-----------+------------+--------+  GSV prox thighno                            0.40              +--------------+---------+------+-----------+------------+--------+  GSV mid thigh           yes                 0.33              +--------------+---------+------+-----------+------------+--------+  GSV dist thigh          yes                 0.31              +--------------+---------+------+-----------+------------+--------+  GSV at knee             yes                 0.32              +--------------+---------+------+-----------+------------+--------+  SSV Pop Fossa no                            0.18              +--------------+---------+------+-----------+------------+--------+      Summary:  Right:  - No evidence of deep vein thrombosis from the common femoral through the  popliteal veins.  - No evidence of superficial venous thrombosis.  - The common femoral vein is not competent.  - The great saphenous vein is not competent.  - The small saphenous vein is competent.     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   Her main concern are her spider veins and increased number of visible veins on B ankles/feet.    Plan:She will continue to wear daily compression.  Exercise daily, elevation of LEs when at rest.  I will have her f/u with Izora Gala our [place] therapy RN for possible intervention.  It is not advised to use sclero on the distal ankle/foot area secondary o thinness of the skin and likelihood of causes non healing ulcers.    F/U as needed.  Roxy Horseman PA-C [facility] Office: [phone]  MD in clinic  [place]

## 2021-01-09 ENCOUNTER — Other Ambulatory Visit (HOSPITAL_COMMUNITY): Payer: Self-pay

## 2021-01-09 MED ORDER — CARESTART COVID-19 HOME TEST VI KIT
PACK | 0 refills | Status: DC
  Filled 2021-01-09: qty 4, 4d supply, fill #0

## 2021-01-15 ENCOUNTER — Other Ambulatory Visit: Payer: Self-pay

## 2021-01-15 ENCOUNTER — Ambulatory Visit (INDEPENDENT_AMBULATORY_CARE_PROVIDER_SITE_OTHER): Payer: 59

## 2021-01-15 DIAGNOSIS — I8393 Asymptomatic varicose veins of bilateral lower extremities: Secondary | ICD-10-CM

## 2021-01-15 NOTE — Progress Notes (Signed)
Treated pt's spider veins and small reticular veins of both LE with Asclera 1% administered with a 27g butterfly.  Patient received a total of 3.75 cc. Easy access. Pt tolerated excellently. Anticipate good results. Post procedure care instructions provided on both handout and verbally. Will follow PRN.    Photos: Yes.    Compression stockings applied: Yes.

## 2021-02-23 ENCOUNTER — Telehealth: Payer: Self-pay

## 2021-02-23 NOTE — Telephone Encounter (Signed)
Called pt to f/u on sclerotherapy from 5 weeks ago. Pt reports most areas treated have visible improvement. One area on outer ankle had a bruise that is resolving and vein treated looks more defined. She is going to try to send pic via [place]. We discussed giving it another month or so to see if things continue to improve before scheduling another treatment. No further questions/concerns at this time.

## 2021-03-09 ENCOUNTER — Encounter: Payer: Self-pay | Admitting: Family Medicine

## 2021-03-09 ENCOUNTER — Ambulatory Visit (INDEPENDENT_AMBULATORY_CARE_PROVIDER_SITE_OTHER): Payer: 59 | Admitting: Family Medicine

## 2021-03-09 VITALS — BP 114/82 | HR 61 | Temp 97.9°F | Resp 16 | Ht 69.0 in | Wt 134.6 lb

## 2021-03-09 DIAGNOSIS — Z0000 Encounter for general adult medical examination without abnormal findings: Secondary | ICD-10-CM | POA: Diagnosis not present

## 2021-03-09 DIAGNOSIS — Z1211 Encounter for screening for malignant neoplasm of colon: Secondary | ICD-10-CM

## 2021-03-09 DIAGNOSIS — Z8249 Family history of ischemic heart disease and other diseases of the circulatory system: Secondary | ICD-10-CM | POA: Diagnosis not present

## 2021-03-09 LAB — CBC WITH DIFFERENTIAL/PLATELET
Basophils Absolute: 0 10*3/uL (ref 0.0–0.1)
Basophils Relative: 0.4 % (ref 0.0–3.0)
Eosinophils Absolute: 0 10*3/uL (ref 0.0–0.7)
Eosinophils Relative: 1.2 % (ref 0.0–5.0)
HCT: 39.9 % (ref 36.0–46.0)
Hemoglobin: 13.1 g/dL (ref 12.0–15.0)
Lymphocytes Relative: 47.8 % — ABNORMAL HIGH (ref 12.0–46.0)
Lymphs Abs: 2 10*3/uL (ref 0.7–4.0)
MCHC: 32.9 g/dL (ref 30.0–36.0)
MCV: 102.1 fl — ABNORMAL HIGH (ref 78.0–100.0)
Monocytes Absolute: 0.3 10*3/uL (ref 0.1–1.0)
Monocytes Relative: 7.8 % (ref 3.0–12.0)
Neutro Abs: 1.8 10*3/uL (ref 1.4–7.7)
Neutrophils Relative %: 42.8 % — ABNORMAL LOW (ref 43.0–77.0)
Platelets: 182 10*3/uL (ref 150.0–400.0)
RBC: 3.91 Mil/uL (ref 3.87–5.11)
RDW: 13.6 % (ref 11.5–15.5)
WBC: 4.1 10*3/uL (ref 4.0–10.5)

## 2021-03-09 LAB — BASIC METABOLIC PANEL
BUN: 16 mg/dL (ref 6–23)
CO2: 28 mEq/L (ref 19–32)
Calcium: 9.5 mg/dL (ref 8.4–10.5)
Chloride: 103 mEq/L (ref 96–112)
Creatinine, Ser: 0.93 mg/dL (ref 0.40–1.20)
GFR: 72.46 mL/min (ref >60.00)
Glucose, Bld: 90 mg/dL (ref 70–99)
Potassium: 4.5 mEq/L (ref 3.5–5.1)
Sodium: 136 mEq/L (ref 135–145)

## 2021-03-09 LAB — HEPATIC FUNCTION PANEL
ALT: 11 U/L (ref 0–35)
AST: 16 U/L (ref 0–37)
Albumin: 4.3 g/dL (ref 3.5–5.2)
Alkaline Phosphatase: 42 U/L (ref 39–117)
Bilirubin, Direct: 0.2 mg/dL (ref 0.0–0.3)
Total Bilirubin: 1.1 mg/dL (ref 0.2–1.2)
Total Protein: 6.4 g/dL (ref 6.0–8.3)

## 2021-03-09 LAB — LIPID PANEL
Cholesterol: 162 mg/dL (ref 0–200)
HDL: 54.5 mg/dL (ref >39.00)
LDL Cholesterol: 99 mg/dL (ref 0–99)
NonHDL: 107.24
Total CHOL/HDL Ratio: 3
Triglycerides: 40 mg/dL (ref 0.0–149.0)
VLDL: 8 mg/dL (ref 0.0–40.0)

## 2021-03-09 LAB — TSH: TSH: 3.03 u[IU]/mL (ref 0.35–5.50)

## 2021-03-09 NOTE — Assessment & Plan Note (Signed)
Pt's PE WNL.  UTD on pap, mammo, immunizations.  Due for colon cancer screening- pt elects cologuard.  Check labs.  Anticipatory guidance provided.

## 2021-03-09 NOTE — Patient Instructions (Signed)
Follow up in 1 year or as needed We'll notify you of your lab results and make any changes if needed Keep up the good work on healthy diet and regular exercise- you look great!!! Complete and return the cologuard as directed Call with any questions or concerns Stay Safe!  Stay Healthy! Happy Holidays!!!

## 2021-03-09 NOTE — Progress Notes (Signed)
   Subjective:    Patient ID: [name], female    DOB: 1970/10/13, 50 y.o.   MRN: [id number]  HPI CPE- due for colon cancer screen.  UTD on pap, flu, Tdap.  UTD on mammo- 09/27/20.  No concerns today  Patient Care Team    Relationship Specialty Notifications Start End  Midge Minium, MD PCP - General Family Medicine  07/17/16   Paula Compton, MD Consulting Physician Obstetrics and Gynecology  07/17/16     Health Maintenance  Topic Date Due   COLONOSCOPY (Pts 45-42yr Insurance coverage will need to be confirmed)  Never done   Hepatitis C Screening  03/09/2022 (Originally 03/28/1990)   PAP SMEAR-Modifier  09/01/2022   TETANUS/TDAP  05/26/2023   INFLUENZA VACCINE  Completed   HIV Screening  Completed   Pneumococcal Vaccine [id number] old  Aged Out   HPV VACCINES  Aged Out      Review of Systems Patient reports no vision/ hearing changes, adenopathy,fever, weight change,  persistant/recurrent hoarseness , swallowing issues, chest pain, palpitations, edema, persistant/recurrent cough, hemoptysis, dyspnea (rest/exertional/paroxysmal nocturnal), gastrointestinal bleeding (melena, rectal bleeding), abdominal pain, significant heartburn, bowel changes, GU symptoms (dysuria, hematuria, incontinence), Gyn symptoms (abnormal  bleeding, pain),  syncope, focal weakness, memory loss, numbness & tingling, skin/hair/nail changes, abnormal bruising or bleeding, anxiety, or depression.   This visit occurred during the SARS-CoV-2 public health emergency.  Safety protocols were in place, including screening questions prior to the visit, additional usage of staff PPE, and extensive cleaning of exam room while observing appropriate contact time as indicated for disinfecting solutions.      Objective:   Physical Exam General Appearance:    Alert, cooperative, no distress, appears stated age  Head:    Normocephalic, without obvious abnormality, atraumatic  Eyes:    PERRL,  conjunctiva/corneas clear, EOM's intact, fundi    benign, both eyes  Ears:    Normal TM's and external ear canals, both ears  Nose:   Deferred due to COVID  Throat:   Neck:   Supple, symmetrical, trachea midline, no adenopathy;    Thyroid: no enlargement/tenderness/nodules  Back:     Symmetric, no curvature, ROM normal, no CVA tenderness  Lungs:     Clear to auscultation bilaterally, respirations unlabored  Chest Wall:    No tenderness or deformity   Heart:    Regular rate and rhythm, S1 and S2 normal, no murmur, rub   or gallop  Breast Exam:    Deferred to GYN  Abdomen:     Soft, non-tender, bowel sounds active all four quadrants,    no masses, no organomegaly  Genitalia:    Deferred to GYN  Rectal:    Extremities:   Extremities normal, atraumatic, no cyanosis or edema  Pulses:   2+ and symmetric all extremities  Skin:   Skin color, texture, turgor normal, no rashes or lesions  Lymph nodes:   Cervical, supraclavicular, and axillary nodes normal  Neurologic:   CNII-XII intact, normal strength, sensation and reflexes    throughout          Assessment & Plan:

## 2021-03-09 NOTE — Assessment & Plan Note (Signed)
Check labs to risk stratify.  Will continue to follow.

## 2021-03-13 ENCOUNTER — Telehealth: Payer: Self-pay

## 2021-03-13 NOTE — Telephone Encounter (Signed)
Pt aware of labs  

## 2021-03-13 NOTE — Telephone Encounter (Signed)
-----   Message from Midge Minium, MD sent at 03/12/2021  7:24 AM EST ----- Labs look great!  No changes at this time

## 2021-03-21 ENCOUNTER — Other Ambulatory Visit (HOSPITAL_COMMUNITY): Payer: Self-pay

## 2021-03-21 DIAGNOSIS — D229 Melanocytic nevi, unspecified: Secondary | ICD-10-CM | POA: Diagnosis not present

## 2021-03-21 DIAGNOSIS — Z23 Encounter for immunization: Secondary | ICD-10-CM | POA: Diagnosis not present

## 2021-03-21 DIAGNOSIS — Z411 Encounter for cosmetic surgery: Secondary | ICD-10-CM | POA: Diagnosis not present

## 2021-03-21 DIAGNOSIS — L739 Follicular disorder, unspecified: Secondary | ICD-10-CM | POA: Diagnosis not present

## 2021-03-21 DIAGNOSIS — L821 Other seborrheic keratosis: Secondary | ICD-10-CM | POA: Diagnosis not present

## 2021-03-21 DIAGNOSIS — Z86018 Personal history of other benign neoplasm: Secondary | ICD-10-CM | POA: Diagnosis not present

## 2021-03-21 DIAGNOSIS — L578 Other skin changes due to chronic exposure to nonionizing radiation: Secondary | ICD-10-CM | POA: Diagnosis not present

## 2021-03-21 DIAGNOSIS — L814 Other melanin hyperpigmentation: Secondary | ICD-10-CM | POA: Diagnosis not present

## 2021-03-21 DIAGNOSIS — D1801 Hemangioma of skin and subcutaneous tissue: Secondary | ICD-10-CM | POA: Diagnosis not present

## 2021-03-21 MED ORDER — TRETINOIN 0.025 % EX CREA
TOPICAL_CREAM | CUTANEOUS | 4 refills | Status: AC
  Filled 2021-03-21: qty 45, 30d supply, fill #0

## 2021-04-26 DIAGNOSIS — Z1211 Encounter for screening for malignant neoplasm of colon: Secondary | ICD-10-CM | POA: Diagnosis not present

## 2021-05-02 ENCOUNTER — Other Ambulatory Visit (HOSPITAL_COMMUNITY): Payer: Self-pay

## 2021-05-03 LAB — COLOGUARD: COLOGUARD: NEGATIVE

## 2021-06-04 ENCOUNTER — Other Ambulatory Visit (HOSPITAL_COMMUNITY): Payer: Self-pay

## 2021-06-04 MED ORDER — CARESTART COVID-19 HOME TEST VI KIT
PACK | 1 refills | Status: DC
  Filled 2021-06-04: qty 4, 4d supply, fill #0

## 2021-06-07 ENCOUNTER — Other Ambulatory Visit (HOSPITAL_COMMUNITY): Payer: Self-pay

## 2022-03-12 ENCOUNTER — Encounter: Payer: Self-pay | Admitting: Family Medicine

## 2022-03-12 ENCOUNTER — Ambulatory Visit (INDEPENDENT_AMBULATORY_CARE_PROVIDER_SITE_OTHER): Payer: 59 | Admitting: Family Medicine

## 2022-03-12 VITALS — BP 118/76 | HR 66 | Temp 97.6°F | Resp 18 | Ht 69.0 in | Wt 133.4 lb

## 2022-03-12 DIAGNOSIS — Z8249 Family history of ischemic heart disease and other diseases of the circulatory system: Secondary | ICD-10-CM

## 2022-03-12 DIAGNOSIS — Z0000 Encounter for general adult medical examination without abnormal findings: Secondary | ICD-10-CM | POA: Diagnosis not present

## 2022-03-12 LAB — CBC WITH DIFFERENTIAL/PLATELET
Basophils Absolute: 0 10*3/uL (ref 0.0–0.1)
Basophils Relative: 0.4 % (ref 0.0–3.0)
Eosinophils Absolute: 0 10*3/uL (ref 0.0–0.7)
Eosinophils Relative: 1 % (ref 0.0–5.0)
HCT: 41.9 % (ref 36.0–46.0)
Hemoglobin: 14.1 g/dL (ref 12.0–15.0)
Lymphocytes Relative: 43.5 % (ref 12.0–46.0)
Lymphs Abs: 2.1 10*3/uL (ref 0.7–4.0)
MCHC: 33.6 g/dL (ref 30.0–36.0)
MCV: 102.2 fl — ABNORMAL HIGH (ref 78.0–100.0)
Monocytes Absolute: 0.3 10*3/uL (ref 0.1–1.0)
Monocytes Relative: 6.4 % (ref 3.0–12.0)
Neutro Abs: 2.3 10*3/uL (ref 1.4–7.7)
Neutrophils Relative %: 48.7 % (ref 43.0–77.0)
Platelets: 196 10*3/uL (ref 150.0–400.0)
RBC: 4.1 Mil/uL (ref 3.87–5.11)
RDW: 13.1 % (ref 11.5–15.5)
WBC: 4.8 10*3/uL (ref 4.0–10.5)

## 2022-03-12 LAB — BASIC METABOLIC PANEL
BUN: 17 mg/dL (ref 6–23)
CO2: 27 mEq/L (ref 19–32)
Calcium: 9.4 mg/dL (ref 8.4–10.5)
Chloride: 104 mEq/L (ref 96–112)
Creatinine, Ser: 0.94 mg/dL (ref 0.40–1.20)
GFR: 71.03 mL/min (ref >60.00)
Glucose, Bld: 95 mg/dL (ref 70–99)
Potassium: 5 mEq/L (ref 3.5–5.1)
Sodium: 140 mEq/L (ref 135–145)

## 2022-03-12 LAB — LIPID PANEL
Cholesterol: 161 mg/dL (ref 0–200)
HDL: 53 mg/dL (ref >39.00)
LDL Cholesterol: 97 mg/dL (ref 0–99)
NonHDL: 107.59
Total CHOL/HDL Ratio: 3
Triglycerides: 52 mg/dL (ref 0.0–149.0)
VLDL: 10.4 mg/dL (ref 0.0–40.0)

## 2022-03-12 LAB — HEPATIC FUNCTION PANEL
ALT: 16 U/L (ref 0–35)
AST: 23 U/L (ref 0–37)
Albumin: 4.7 g/dL (ref 3.5–5.2)
Alkaline Phosphatase: 46 U/L (ref 39–117)
Bilirubin, Direct: 0.1 mg/dL (ref 0.0–0.3)
Total Bilirubin: 1 mg/dL (ref 0.2–1.2)
Total Protein: 6.7 g/dL (ref 6.0–8.3)

## 2022-03-12 LAB — TSH: TSH: 2.74 u[IU]/mL (ref 0.35–5.50)

## 2022-03-12 NOTE — Progress Notes (Signed)
   Subjective:    Patient ID: [name], female    DOB: Mar 12, 1971, 51 y.o.   MRN: [id number]  HPI CPE- UTD on pap, cologuard, Tdap, flu.  Scheduled for mammo next week  Patient Care Team    Relationship Specialty Notifications Start End  Midge Minium, MD PCP - General Family Medicine  07/17/16   Paula Compton, MD Consulting Physician Obstetrics and Gynecology  07/17/16     Health Maintenance  Topic Date Due   MAMMOGRAM  08/31/2020   PAP SMEAR-Modifier  09/01/2022   DTaP/Tdap/Td (2 - Td or Tdap) 05/26/2023   Fecal DNA (Cologuard)  04/26/2024   INFLUENZA VACCINE  Completed   HIV Screening  Completed   HPV VACCINES  Aged Out   Hepatitis C Screening  Discontinued      Review of Systems Patient reports no vision/ hearing changes, adenopathy,fever, weight change,  persistant/recurrent hoarseness , swallowing issues, chest pain, palpitations, edema, persistant/recurrent cough, hemoptysis, dyspnea (rest/exertional/paroxysmal nocturnal), gastrointestinal bleeding (melena, rectal bleeding), abdominal pain, significant heartburn, bowel changes, GU symptoms (dysuria, hematuria, incontinence), Gyn symptoms (abnormal  bleeding, pain),  syncope, focal weakness, memory loss, numbness & tingling, skin/hair/nail changes, abnormal bruising or bleeding, anxiety, or depression.     Objective:   Physical Exam General Appearance:    Alert, cooperative, no distress, appears stated age  Head:    Normocephalic, without obvious abnormality, atraumatic  Eyes:    PERRL, conjunctiva/corneas clear, EOM's intact, fundi    benign, both eyes  Ears:    Normal TM's and external ear canals, both ears  Nose:   Nares normal, septum midline, mucosa normal, no drainage    or sinus tenderness  Throat:   Lips, mucosa, and tongue normal; teeth and gums normal  Neck:   Supple, symmetrical, trachea midline, no adenopathy;    Thyroid: no enlargement/tenderness/nodules  Back:     Symmetric, no curvature,  ROM normal, no CVA tenderness  Lungs:     Clear to auscultation bilaterally, respirations unlabored  Chest Wall:    No tenderness or deformity   Heart:    Regular rate and rhythm, S1 and S2 normal, no murmur, rub   or gallop  Breast Exam:    Deferred to GYN  Abdomen:     Soft, non-tender, bowel sounds active all four quadrants,    no masses, no organomegaly  Genitalia:    Deferred to GYN  Rectal:    Extremities:   Extremities normal, atraumatic, no cyanosis or edema  Pulses:   2+ and symmetric all extremities  Skin:   Skin color, texture, turgor normal, no rashes or lesions  Lymph nodes:   Cervical, supraclavicular, and axillary nodes normal  Neurologic:   CNII-XII intact, normal strength, sensation and reflexes    throughout          Assessment & Plan:

## 2022-03-12 NOTE — Assessment & Plan Note (Signed)
Check labs to risk stratify 

## 2022-03-12 NOTE — Patient Instructions (Signed)
Follow up in 1 year or as needed We'll notify you of your lab results and make any changes if needed Keep up the good work!  You look great! Call with any questions or concerns Stay Safe!  Stay Healthy! Happy Holidays!!!

## 2022-03-12 NOTE — Assessment & Plan Note (Signed)
Pt's PE WNL.  UTD on pap, Cologuard, Tdap, flu.  Mammo scheduled.  Check labs.  Anticipatory guidance provided.

## 2022-03-13 ENCOUNTER — Telehealth: Payer: Self-pay

## 2022-03-13 NOTE — Telephone Encounter (Signed)
-----   Message from Midge Minium, MD sent at 03/13/2022  7:34 AM EST ----- Labs look great!  No changes at this time

## 2022-03-13 NOTE — Telephone Encounter (Signed)
Informed pt of lab results  

## 2022-04-11 DIAGNOSIS — Z411 Encounter for cosmetic surgery: Secondary | ICD-10-CM | POA: Diagnosis not present

## 2022-04-11 DIAGNOSIS — Z86018 Personal history of other benign neoplasm: Secondary | ICD-10-CM | POA: Diagnosis not present

## 2022-04-11 DIAGNOSIS — L578 Other skin changes due to chronic exposure to nonionizing radiation: Secondary | ICD-10-CM | POA: Diagnosis not present

## 2022-04-11 DIAGNOSIS — D485 Neoplasm of uncertain behavior of skin: Secondary | ICD-10-CM | POA: Diagnosis not present

## 2022-04-11 DIAGNOSIS — L814 Other melanin hyperpigmentation: Secondary | ICD-10-CM | POA: Diagnosis not present

## 2022-04-11 DIAGNOSIS — C44619 Basal cell carcinoma of skin of left upper limb, including shoulder: Secondary | ICD-10-CM | POA: Diagnosis not present

## 2022-04-11 DIAGNOSIS — L720 Epidermal cyst: Secondary | ICD-10-CM | POA: Diagnosis not present

## 2022-04-11 DIAGNOSIS — D1801 Hemangioma of skin and subcutaneous tissue: Secondary | ICD-10-CM | POA: Diagnosis not present

## 2022-04-11 DIAGNOSIS — D229 Melanocytic nevi, unspecified: Secondary | ICD-10-CM | POA: Diagnosis not present

## 2022-04-11 DIAGNOSIS — L821 Other seborrheic keratosis: Secondary | ICD-10-CM | POA: Diagnosis not present

## 2022-04-13 ENCOUNTER — Other Ambulatory Visit (HOSPITAL_COMMUNITY): Payer: Self-pay

## 2022-06-18 DIAGNOSIS — C44619 Basal cell carcinoma of skin of left upper limb, including shoulder: Secondary | ICD-10-CM | POA: Diagnosis not present

## 2022-07-04 DIAGNOSIS — Z1151 Encounter for screening for human papillomavirus (HPV): Secondary | ICD-10-CM | POA: Diagnosis not present

## 2022-07-04 DIAGNOSIS — Z0142 Encounter for cervical smear to confirm findings of recent normal smear following initial abnormal smear: Secondary | ICD-10-CM | POA: Diagnosis not present

## 2022-07-04 DIAGNOSIS — Z124 Encounter for screening for malignant neoplasm of cervix: Secondary | ICD-10-CM | POA: Diagnosis not present

## 2022-07-04 DIAGNOSIS — Z1272 Encounter for screening for malignant neoplasm of vagina: Secondary | ICD-10-CM | POA: Diagnosis not present

## 2022-10-17 DIAGNOSIS — L578 Other skin changes due to chronic exposure to nonionizing radiation: Secondary | ICD-10-CM | POA: Diagnosis not present

## 2022-10-17 DIAGNOSIS — D1801 Hemangioma of skin and subcutaneous tissue: Secondary | ICD-10-CM | POA: Diagnosis not present

## 2022-10-17 DIAGNOSIS — L814 Other melanin hyperpigmentation: Secondary | ICD-10-CM | POA: Diagnosis not present

## 2022-10-17 DIAGNOSIS — Z86018 Personal history of other benign neoplasm: Secondary | ICD-10-CM | POA: Diagnosis not present

## 2022-10-17 DIAGNOSIS — L821 Other seborrheic keratosis: Secondary | ICD-10-CM | POA: Diagnosis not present

## 2022-10-17 DIAGNOSIS — D229 Melanocytic nevi, unspecified: Secondary | ICD-10-CM | POA: Diagnosis not present

## 2023-03-14 ENCOUNTER — Encounter: Payer: Self-pay | Admitting: Family Medicine

## 2023-04-17 ENCOUNTER — Encounter: Payer: 59 | Admitting: Family Medicine

## 2023-04-17 DIAGNOSIS — C44619 Basal cell carcinoma of skin of left upper limb, including shoulder: Secondary | ICD-10-CM | POA: Insufficient documentation

## 2023-04-22 DIAGNOSIS — D229 Melanocytic nevi, unspecified: Secondary | ICD-10-CM | POA: Diagnosis not present

## 2023-04-22 DIAGNOSIS — L821 Other seborrheic keratosis: Secondary | ICD-10-CM | POA: Diagnosis not present

## 2023-04-22 DIAGNOSIS — L814 Other melanin hyperpigmentation: Secondary | ICD-10-CM | POA: Diagnosis not present

## 2023-04-22 DIAGNOSIS — L578 Other skin changes due to chronic exposure to nonionizing radiation: Secondary | ICD-10-CM | POA: Diagnosis not present

## 2023-04-22 DIAGNOSIS — Z86018 Personal history of other benign neoplasm: Secondary | ICD-10-CM | POA: Diagnosis not present

## 2023-04-22 DIAGNOSIS — D1801 Hemangioma of skin and subcutaneous tissue: Secondary | ICD-10-CM | POA: Diagnosis not present

## 2023-09-03 ENCOUNTER — Other Ambulatory Visit (HOSPITAL_COMMUNITY): Payer: Self-pay

## 2023-09-03 ENCOUNTER — Other Ambulatory Visit: Payer: Self-pay

## 2023-09-03 DIAGNOSIS — Z1389 Encounter for screening for other disorder: Secondary | ICD-10-CM | POA: Diagnosis not present

## 2023-09-03 DIAGNOSIS — Z130 Encounter for screening for diseases of the blood and blood-forming organs and certain disorders involving the immune mechanism: Secondary | ICD-10-CM | POA: Diagnosis not present

## 2023-09-03 DIAGNOSIS — N951 Menopausal and female climacteric states: Secondary | ICD-10-CM | POA: Diagnosis not present

## 2023-09-03 DIAGNOSIS — Z01419 Encounter for gynecological examination (general) (routine) without abnormal findings: Secondary | ICD-10-CM | POA: Diagnosis not present

## 2023-09-03 DIAGNOSIS — Z30431 Encounter for routine checking of intrauterine contraceptive device: Secondary | ICD-10-CM | POA: Diagnosis not present

## 2023-09-03 DIAGNOSIS — Z1231 Encounter for screening mammogram for malignant neoplasm of breast: Secondary | ICD-10-CM | POA: Diagnosis not present

## 2023-09-03 MED ORDER — ESTRADIOL 0.075 MG/24HR TD PTTW
1.0000 | MEDICATED_PATCH | TRANSDERMAL | 3 refills | Status: AC
  Filled 2023-09-03: qty 24, 84d supply, fill #0
  Filled 2023-11-26: qty 24, 84d supply, fill #1
  Filled 2024-03-11: qty 24, 84d supply, fill #2

## 2023-09-04 ENCOUNTER — Other Ambulatory Visit (HOSPITAL_COMMUNITY): Payer: Self-pay

## 2023-10-28 DIAGNOSIS — F4322 Adjustment disorder with anxiety: Secondary | ICD-10-CM | POA: Diagnosis not present

## 2023-11-05 DIAGNOSIS — L578 Other skin changes due to chronic exposure to nonionizing radiation: Secondary | ICD-10-CM | POA: Diagnosis not present

## 2023-11-05 DIAGNOSIS — B078 Other viral warts: Secondary | ICD-10-CM | POA: Diagnosis not present

## 2023-11-05 DIAGNOSIS — D1801 Hemangioma of skin and subcutaneous tissue: Secondary | ICD-10-CM | POA: Diagnosis not present

## 2023-11-05 DIAGNOSIS — L814 Other melanin hyperpigmentation: Secondary | ICD-10-CM | POA: Diagnosis not present

## 2023-11-05 DIAGNOSIS — C4441 Basal cell carcinoma of skin of scalp and neck: Secondary | ICD-10-CM | POA: Diagnosis not present

## 2023-11-05 DIAGNOSIS — D229 Melanocytic nevi, unspecified: Secondary | ICD-10-CM | POA: Diagnosis not present

## 2023-11-05 DIAGNOSIS — Z86018 Personal history of other benign neoplasm: Secondary | ICD-10-CM | POA: Diagnosis not present

## 2023-11-05 DIAGNOSIS — L570 Actinic keratosis: Secondary | ICD-10-CM | POA: Diagnosis not present

## 2023-11-05 DIAGNOSIS — D485 Neoplasm of uncertain behavior of skin: Secondary | ICD-10-CM | POA: Diagnosis not present

## 2023-11-05 DIAGNOSIS — L821 Other seborrheic keratosis: Secondary | ICD-10-CM | POA: Diagnosis not present

## 2023-11-06 DIAGNOSIS — F4322 Adjustment disorder with anxiety: Secondary | ICD-10-CM | POA: Diagnosis not present

## 2023-11-29 ENCOUNTER — Other Ambulatory Visit (HOSPITAL_COMMUNITY): Payer: Self-pay

## 2024-02-10 ENCOUNTER — Other Ambulatory Visit (HOSPITAL_COMMUNITY): Payer: Self-pay

## 2024-02-18 DIAGNOSIS — F4322 Adjustment disorder with anxiety: Secondary | ICD-10-CM | POA: Diagnosis not present

## 2024-02-24 DIAGNOSIS — L988 Other specified disorders of the skin and subcutaneous tissue: Secondary | ICD-10-CM | POA: Diagnosis not present

## 2024-03-02 DIAGNOSIS — F4322 Adjustment disorder with anxiety: Secondary | ICD-10-CM | POA: Diagnosis not present

## 2024-03-09 DIAGNOSIS — F4322 Adjustment disorder with anxiety: Secondary | ICD-10-CM | POA: Diagnosis not present
